# Patient Record
Sex: Male | Born: 1985 | Race: Black or African American | Hispanic: No | Marital: Single | Smoking: Current every day smoker
Health system: Southern US, Community
[De-identification: ages and names within clinical notes are randomized; demographics above are authoritative.]

## PROBLEM LIST (undated history)

## (undated) ENCOUNTER — Ambulatory Visit (HOSPITAL_COMMUNITY): Admission: EM | Payer: Self-pay | Source: Home / Self Care

## (undated) DIAGNOSIS — C801 Malignant (primary) neoplasm, unspecified: Secondary | ICD-10-CM

## (undated) DIAGNOSIS — F419 Anxiety disorder, unspecified: Secondary | ICD-10-CM

## (undated) DIAGNOSIS — F329 Major depressive disorder, single episode, unspecified: Secondary | ICD-10-CM

## (undated) DIAGNOSIS — F32A Depression, unspecified: Secondary | ICD-10-CM

## (undated) HISTORY — PX: BRAIN SURGERY: SHX531

## (undated) HISTORY — DX: Malignant (primary) neoplasm, unspecified: C80.1

---

## 2006-05-27 ENCOUNTER — Emergency Department (HOSPITAL_COMMUNITY): Admission: EM | Admit: 2006-05-27 | Discharge: 2006-05-27 | Payer: Self-pay | Admitting: Emergency Medicine

## 2007-01-04 ENCOUNTER — Emergency Department (HOSPITAL_COMMUNITY): Admission: EM | Admit: 2007-01-04 | Discharge: 2007-01-04 | Payer: Self-pay | Admitting: Emergency Medicine

## 2012-11-04 ENCOUNTER — Emergency Department (HOSPITAL_COMMUNITY)
Admission: EM | Admit: 2012-11-04 | Discharge: 2012-11-04 | Disposition: A | Discharge status: Home / Self Care | Payer: Self-pay | Attending: Emergency Medicine | Admitting: Emergency Medicine

## 2012-11-04 ENCOUNTER — Telehealth (HOSPITAL_COMMUNITY): Payer: Self-pay

## 2012-11-04 ENCOUNTER — Encounter (HOSPITAL_COMMUNITY): Payer: Self-pay | Admitting: Emergency Medicine

## 2012-11-04 ENCOUNTER — Encounter (HOSPITAL_COMMUNITY): Payer: Self-pay

## 2012-11-04 ENCOUNTER — Inpatient Hospital Stay (HOSPITAL_COMMUNITY): Admit: 2012-11-04 | Payer: Self-pay

## 2012-11-04 DIAGNOSIS — F32A Depression, unspecified: Secondary | ICD-10-CM

## 2012-11-04 DIAGNOSIS — F10929 Alcohol use, unspecified with intoxication, unspecified: Secondary | ICD-10-CM

## 2012-11-04 DIAGNOSIS — J3489 Other specified disorders of nose and nasal sinuses: Secondary | ICD-10-CM | POA: Insufficient documentation

## 2012-11-04 DIAGNOSIS — C801 Malignant (primary) neoplasm, unspecified: Secondary | ICD-10-CM | POA: Insufficient documentation

## 2012-11-04 DIAGNOSIS — F1010 Alcohol abuse, uncomplicated: Secondary | ICD-10-CM | POA: Insufficient documentation

## 2012-11-04 DIAGNOSIS — R45851 Suicidal ideations: Secondary | ICD-10-CM | POA: Insufficient documentation

## 2012-11-04 DIAGNOSIS — F419 Anxiety disorder, unspecified: Secondary | ICD-10-CM

## 2012-11-04 DIAGNOSIS — F3289 Other specified depressive episodes: Secondary | ICD-10-CM | POA: Insufficient documentation

## 2012-11-04 DIAGNOSIS — R45850 Homicidal ideations: Secondary | ICD-10-CM | POA: Insufficient documentation

## 2012-11-04 DIAGNOSIS — F329 Major depressive disorder, single episode, unspecified: Secondary | ICD-10-CM | POA: Insufficient documentation

## 2012-11-04 LAB — COMPREHENSIVE METABOLIC PANEL
ALT: 16 U/L (ref 0–53)
AST: 30 U/L (ref 0–37)
Alkaline Phosphatase: 65 U/L (ref 39–117)
CO2: 28 mEq/L (ref 19–32)
Chloride: 103 mEq/L (ref 96–112)
GFR calc Af Amer: >90 mL/min (ref >90)
GFR calc non Af Amer: >90 mL/min (ref >90)
Glucose, Bld: 94 mg/dL (ref 70–99)
Sodium: 140 mEq/L (ref 135–145)
Total Bilirubin: 0.1 mg/dL — ABNORMAL LOW (ref 0.3–1.2)

## 2012-11-04 LAB — CBC
Hemoglobin: 15 g/dL (ref 13.0–17.0)
MCH: 32.4 pg (ref 26.0–34.0)
Platelets: 216 10*3/uL (ref 150–400)
RBC: 4.63 MIL/uL (ref 4.22–5.81)
WBC: 5.6 10*3/uL (ref 4.0–10.5)

## 2012-11-04 LAB — RAPID URINE DRUG SCREEN, HOSP PERFORMED
Barbiturates: NOT DETECTED
Cocaine: POSITIVE — AB
Tetrahydrocannabinol: POSITIVE — AB

## 2012-11-04 MED ORDER — LORAZEPAM 1 MG PO TABS: 1.0000 mg | ORAL_TABLET | Freq: Three times a day (TID) | ORAL | Status: DC | PRN

## 2012-11-04 MED ORDER — ZIPRASIDONE MESYLATE 20 MG IM SOLR
10.0000 mg | Freq: Once | INTRAMUSCULAR | Status: AC
  Administered 2012-11-04: 10 mg via INTRAMUSCULAR
  Filled 2012-11-04: qty 20

## 2012-11-04 NOTE — [facility] Counselor (Signed)
Patient evaluated by Dr. Elsie Saas and per his recommendations patient will be discharged home with follow up referrals. Writer met with patient and provided him with various outpatient referrals for both mental health and substance abuse programs. Patient wasn't receptive to participating in any substance abuse programs, however; agreeable to follow up with [facility] for medication management.

## 2012-11-04 NOTE — BH Assessment (Signed)
[facility] Assessment Progress Note    Patient's mother said that the patient had been drinking tonight. The amount and pattern of usage of alcohol is unknown, due to the patient being too intoxicated and psychotic to answer questions. The patient's mother also provided collateral information about his medical history. 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 The tumor responded to chemotherapy and radiation, and the patient has been cancer-free ever since, according to mother. She denies any knowledge of the patient's using drugs. The patient was accepted by Jorje Guild, PA pending medical clearance and stabilization at [facility]. The patient was also sent to [facility] for involuntary commitment, since he refused to sign himself in.
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
## 2012-11-04 NOTE — ED Notes (Signed)
Sitter present at pt bedside.
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
## 2012-11-04 NOTE — ED Provider Notes (Signed)
History     CSN: [id number]  Arrival date & time 11/04/12  0300   First MD Initiated Contact with Patient 11/04/12 0310      Chief Complaint  Patient presents with  . Medical Clearance    (Consider location/radiation/quality/duration/timing/severity/associated sxs/prior treatment) The history is provided by the patient and the police. The history is limited by the condition of the patient.  pt w hx depression, from [facility] where his mother brought him. Per report, depressed since death of son.  Has been drinking intermittently since then, thoughts of depression, tonight making statements to family about using machete to harm self, others. Pt admits to etoh abuse, cant quantify amount. Pt uncooperative - level 5 caveat.    Past Medical History  Diagnosis Date  . Cancer     History reviewed. No pertinent past surgical history.  No family history on file.  History  Substance Use Topics  . Smoking status: Not on file  . Smokeless tobacco: Not on file  . Alcohol Use: Yes      Review of Systems  Unable to perform ROS: Psychiatric disorder  level 5 caveat   Allergies  Review of patient's allergies indicates no known allergies.  Home Medications  No current outpatient prescriptions on file.  BP 152/97  Pulse 81  Temp(Src) 98.2 F (36.8 C) (Oral)  Resp 18  SpO2 100%  Physical Exam  Nursing note and vitals reviewed. Constitutional: He appears well-developed and well-nourished. No distress.  HENT:  Head: Atraumatic.  Mouth/Throat: Oropharyngeal exudate present.  Eyes: Conjunctivae are normal. Pupils are equal, round, and reactive to light. No scleral icterus.  Neck: Neck supple. No tracheal deviation present.  Cardiovascular: Normal rate.   Pulmonary/Chest: Effort normal. No accessory muscle usage. No respiratory distress.  Abdominal: Soft. He exhibits no distension. There is no tenderness.  Musculoskeletal: Normal range of motion. He exhibits no edema and no  tenderness.  Neurological: He is alert.  Alert, ambulatory in room.   Skin: Skin is warm and dry. He is not diaphoretic.  Psychiatric:  Agitated, angry, uncooperative.     ED Course  Procedures (including critical care time)  Results for orders placed during the hospital encounter of 11/04/12  CBC      Result Value Range   WBC 5.6  4.0 - 10.5 K/uL   RBC 4.63  4.22 - 5.81 MIL/uL   Hemoglobin 15.0  13.0 - 17.0 g/dL   HCT 82.9  56.2 - 13.0 %   MCV 91.1  78.0 - 100.0 fL   MCH 32.4  26.0 - 34.0 pg   MCHC 35.5  30.0 - 36.0 g/dL   RDW 86.5  78.4 - 69.6 %   Platelets 216  150 - 400 K/uL  COMPREHENSIVE METABOLIC PANEL      Result Value Range   Sodium 140  135 - 145 mEq/L   Potassium 3.7  3.5 - 5.1 mEq/L   Chloride 103  96 - 112 mEq/L   CO2 28  19 - 32 mEq/L   Glucose, Bld 94  70 - 99 mg/dL   BUN 6  6 - 23 mg/dL   Creatinine, Ser 2.95  0.50 - 1.35 mg/dL   Calcium 9.0  8.4 - 28.4 mg/dL   Total Protein 7.8  6.0 - 8.3 g/dL   Albumin 4.3  3.5 - 5.2 g/dL   AST 30  0 - 37 U/L   ALT 16  0 - 53 U/L   Alkaline Phosphatase 65  39 -  117 U/L   Total Bilirubin 0.1 (*) 0.3 - 1.2 mg/dL   GFR calc non Af Amer >90  >90 mL/min   GFR calc Af Amer >90  >90 mL/min  ETHANOL      Result Value Range   Alcohol, Ethyl (B) 318 (*) 0 - 11 mg/dL        MDM  Labs.  Reviewed nursing notes and prior charts for additional history.   Law enforcement notes pt making threatening statements about harming self or others. Pt uncooperative w eval in ed.  ivc papers filled out as pt appears danger to self/others.  Act team called to assess/place.   Pt with paranoid thoughts, acting as if staff trying to poison him. Periods agitation. Attempt to calm.  Pt remains w agitation, ?paranoid thoughts.  geodon im.  Act team called.  etoh returns very high.  Will plan telepsych eval when more sober, able to comply w interview.   Will sign out to am provider to f/u with telepsych eval, recheck pt, and  dispo appropriately.       Suzi Roots, MD 11/04/12 [phone]

## 2012-11-04 NOTE — BH Assessment (Signed)
[facility] Assessment Progress Note  Reviewed pt with Assunta Found, FNP.  She requests the following: Urine Drug Screen, repeat vital signs, and EKG.  At 09:38 I called Melynda Ripple, Assessment Counselor, to notify her.  Doylene Canning, MA Assessment Counselor 11/04/2012 @ 09:58

## 2012-11-04 NOTE — [facility]HH Counselor (Signed)
Per patients nurse, Sue Lush she has spoken with Colin Pellegrini, NP and the EKG is not needed. Patient needs repeat vitals which was completed. Patient also needs UDS and has not been completed. Sue Lush will continue working with patient to obtain his UDS.
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
## 2012-11-04 NOTE — ED Notes (Signed)
Unable to obtain informations to complete assessment at this time, pt. Is uncooperative.

## 2012-11-04 NOTE — [facility] Suicide Risk Assessment (Signed)
Suicide Risk Assessment  Discharge Assessment     Demographic Factors:  Male, Adolescent or young adult and Low socioeconomic status  Mental Status Per Nursing Assessment::   On Admission:     Current Mental Status by Physician: NA  Loss Factors: Financial problems/change in socioeconomic status   Historical Factors: NA  Risk Reduction Factors:   Sense of responsibility to family, Living with another person, especially a relative, Positive social support and Positive therapeutic relationship  Continued Clinical Symptoms:  Alcohol/Substance Abuse/Dependencies  Cognitive Features That Contribute To Risk:  Polarized thinking    Suicide Risk:  Minimal: No identifiable suicidal ideation.  Patients presenting with no risk factors but with morbid ruminations; may be classified as minimal risk based on the severity of the depressive symptoms  Discharge Diagnoses:   AXIS I:  Alcohol Abuse and Depressive Disorder NOS AXIS II:  Deferred AXIS III:   Past Medical History  Diagnosis Date  . Cancer    AXIS IV:  other psychosocial or environmental problems and problems related to social environment AXIS V:  51-60 moderate symptoms  Plan Of Care/Follow-up recommendations:  Activity:  as toleratred Diet:  regular  Is patient on multiple antipsychotic therapies at discharge:  No   Has Patient had three or more failed trials of antipsychotic monotherapy by history:  No  Recommended Plan for Multiple Antipsychotic Therapies: Not applicable  Colin Gamble,Colin R. 11/04/2012, 11:55 AM
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
## 2012-11-11 ENCOUNTER — Encounter (HOSPITAL_COMMUNITY): Payer: Self-pay | Admitting: *Deleted

## 2012-11-11 ENCOUNTER — Emergency Department (HOSPITAL_COMMUNITY)
Admission: EM | Admit: 2012-11-11 | Discharge: 2012-11-11 | Disposition: A | Discharge status: Home / Self Care | Payer: Self-pay | Attending: Emergency Medicine | Admitting: Emergency Medicine

## 2012-11-11 ENCOUNTER — Inpatient Hospital Stay (HOSPITAL_COMMUNITY)
Admission: AD | Admit: 2012-11-11 | Payer: No Typology Code available for payment source | Source: Intra-hospital | Admitting: Psychiatry

## 2012-11-11 DIAGNOSIS — R443 Hallucinations, unspecified: Secondary | ICD-10-CM | POA: Insufficient documentation

## 2012-11-11 DIAGNOSIS — R45851 Suicidal ideations: Secondary | ICD-10-CM | POA: Insufficient documentation

## 2012-11-11 DIAGNOSIS — F329 Major depressive disorder, single episode, unspecified: Secondary | ICD-10-CM | POA: Insufficient documentation

## 2012-11-11 DIAGNOSIS — F3289 Other specified depressive episodes: Secondary | ICD-10-CM | POA: Insufficient documentation

## 2012-11-11 DIAGNOSIS — F1410 Cocaine abuse, uncomplicated: Secondary | ICD-10-CM | POA: Insufficient documentation

## 2012-11-11 DIAGNOSIS — Z859 Personal history of malignant neoplasm, unspecified: Secondary | ICD-10-CM | POA: Insufficient documentation

## 2012-11-11 DIAGNOSIS — F1010 Alcohol abuse, uncomplicated: Secondary | ICD-10-CM | POA: Insufficient documentation

## 2012-11-11 LAB — CBC WITH DIFFERENTIAL/PLATELET
Basophils Relative: 1 % (ref 0–1)
HCT: 40.5 % (ref 39.0–52.0)
Hemoglobin: 14.5 g/dL (ref 13.0–17.0)
Lymphocytes Relative: 43 % (ref 12–46)
MCHC: 35.8 g/dL (ref 30.0–36.0)
Monocytes Relative: 7 % (ref 3–12)
Neutro Abs: 2.6 10*3/uL (ref 1.7–7.7)
Neutrophils Relative %: 49 % (ref 43–77)
RBC: 4.45 MIL/uL (ref 4.22–5.81)
WBC: 5.3 10*3/uL (ref 4.0–10.5)

## 2012-11-11 LAB — URINALYSIS, ROUTINE W REFLEX MICROSCOPIC
Leukocytes, UA: NEGATIVE
Protein, ur: NEGATIVE mg/dL
Urobilinogen, UA: 0.2 mg/dL (ref 0.0–1.0)

## 2012-11-11 LAB — COMPREHENSIVE METABOLIC PANEL
AST: 27 U/L (ref 0–37)
Albumin: 4.3 g/dL (ref 3.5–5.2)
Alkaline Phosphatase: 61 U/L (ref 39–117)
BUN: 9 mg/dL (ref 6–23)
CO2: 27 mEq/L (ref 19–32)
Chloride: 106 mEq/L (ref 96–112)
GFR calc non Af Amer: >90 mL/min (ref >90)
Potassium: 3.8 mEq/L (ref 3.5–5.1)
Total Bilirubin: 0.2 mg/dL — ABNORMAL LOW (ref 0.3–1.2)

## 2012-11-11 LAB — RAPID URINE DRUG SCREEN, HOSP PERFORMED
Amphetamines: NOT DETECTED
Tetrahydrocannabinol: NOT DETECTED

## 2012-11-11 LAB — ETHANOL: Alcohol, Ethyl (B): 282 mg/dL — ABNORMAL HIGH (ref 0–11)

## 2012-11-11 NOTE — ED Notes (Signed)
Pt states he is depressed and he does want detox.

## 2012-11-11 NOTE — ED Provider Notes (Signed)
History     CSN: [id number]  Arrival date & time 11/11/12  1153   First MD Initiated Contact with Patient 11/11/12 1204      Chief Complaint  Patient presents with  . Drug / Alcohol Assessment    (Consider location/radiation/quality/duration/timing/severity/associated sxs/prior treatment) HPI Pt went to [facility] this AM for depression and EtOH abuse and was told to come to the ED for evaluation. Pt states he has had increasing depression since stopping his medications in 2011. Admits to episodic heavy alcohol intake. Last drank this AM. Occasional cocaine use. Admits to occasional vague SI with no plan. No current SI or HI.  Past Medical History  Diagnosis Date  . Cancer     No past surgical history on file.  No family history on file.  History  Substance Use Topics  . Smoking status: Not on file  . Smokeless tobacco: Not on file  . Alcohol Use: 3.6 oz/week    6 Cans of beer per week      Review of Systems  Constitutional: Negative for fever.  Respiratory: Negative for cough and shortness of breath.   Cardiovascular: Negative for chest pain.  Gastrointestinal: Negative for nausea, vomiting and abdominal pain.  Musculoskeletal: Negative for back pain.  Skin: Negative for rash and wound.  Neurological: Negative for dizziness, weakness, light-headedness, numbness and headaches.  Psychiatric/Behavioral: Positive for suicidal ideas, behavioral problems and dysphoric mood.    Allergies  Review of patient's allergies indicates no known allergies.  Home Medications  No current outpatient prescriptions on file.  BP 125/64  Pulse 103  Temp(Src) 98.4 F (36.9 C) (Oral)  Resp 14  SpO2 98%  Physical Exam  Nursing note and vitals reviewed. Constitutional: He is oriented to person, place, and time. He appears well-developed and well-nourished. No distress.  HENT:  Head: Normocephalic and atraumatic.  Mouth/Throat: Oropharynx is clear and moist.  Eyes: EOM are normal.  Pupils are equal, round, and reactive to light.  Neck: Normal range of motion. Neck supple.  Cardiovascular: Normal rate and regular rhythm.   Pulmonary/Chest: Effort normal and breath sounds normal. No respiratory distress. He has no wheezes. He has no rales.  Abdominal: Soft. Bowel sounds are normal. He exhibits no distension and no mass. There is no tenderness. There is no rebound and no guarding.  Musculoskeletal: Normal range of motion. He exhibits no edema and no tenderness.  Neurological: He is alert and oriented to person, place, and time.  5/5 motor, sensation intact  Skin: Skin is warm and dry. No rash noted. No erythema.  Psychiatric:  Flat affect    ED Course  Procedures (including critical care time)  Labs Reviewed  COMPREHENSIVE METABOLIC PANEL - Abnormal; Notable for the following:    Total Bilirubin 0.2 (*)    All other components within normal limits  ETHANOL - Abnormal; Notable for the following:    Alcohol, Ethyl (B) 282 (*)    All other components within normal limits  CBC WITH DIFFERENTIAL  URINE RAPID DRUG SCREEN (HOSP PERFORMED)  URINALYSIS, ROUTINE W REFLEX MICROSCOPIC   No results found.   1. Alcohol abuse       MDM    Medically cleared for ACT eval      Loren Racer, MD 11/12/12 [phone]
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
## 2012-11-11 NOTE — [facility] Counselor (Signed)
Charyl Bigger, assessment counselor at [facility], submitted Pt for admission to [facility]. Shuvon Rankin, NP reviewed clinical information and accepted Pt to the service of Dr. Geoffery Lyons, room 305-2.  Harlin Rain Patsy Baltimore, LPC, [facility] Assessment Counselor

## 2012-11-11 NOTE — ED Notes (Signed)
Pt agrees he needs to stop drinking however, he doesn't want to go to detox facility. He said he will follow up this week at [facility]. He wanted EDP to give him prescription for anti-depressant but EDP refused. Pt denies SI/HI/AVH. Discussed with pt the fact he had a bed at [facility] for tonight and could probably leave there Sunday and be started on anti-depressant and detox because he's concerned about missing school but he still didn't want to go to treatment. Outpt referrals were given to him about a week ago and he said he still has them. Will try to locate more as we don't have ACTT here at this time.

## 2012-11-11 NOTE — ED Provider Notes (Signed)
Patient has been accepted at behavioral health. He no longer wants to stay. He is not suicidal or homicidal. He states he needs to go . He will followup.  Colin Gamble. Rubin Payor, MD 11/11/12 2116
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
## 2012-11-11 NOTE — ED Notes (Signed)
Cousin name is Deanna Artis [phone]  Cousin. Pt aunt name is beverly  [phone]  Pt lives in home with disabled mother and his aunt. Per cousin,  mother and aunt have to lock their bedroom doors at night because they are fearful of pt when he is  drinking. 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 He does not have a job. Pt get extremely agitated when he is drinking and starts fights and has hallucinations and becomes paranoid. Pt son died at 42 years old and pt was incarcerated and was unable to attend the funeral pt was taking prozac in jail but quit when he began school he quit his medication.

## 2012-11-11 NOTE — ED Notes (Signed)
Moving pt to psych ed

## 2012-12-30 ENCOUNTER — Emergency Department (HOSPITAL_COMMUNITY): Payer: Self-pay

## 2012-12-30 ENCOUNTER — Encounter (HOSPITAL_COMMUNITY): Payer: Self-pay | Admitting: *Deleted

## 2012-12-30 ENCOUNTER — Emergency Department (HOSPITAL_COMMUNITY)
Admission: EM | Admit: 2012-12-30 | Discharge: 2012-12-30 | Disposition: A | Discharge status: Home / Self Care | Payer: Self-pay | Attending: Emergency Medicine | Admitting: Emergency Medicine

## 2012-12-30 DIAGNOSIS — Y9210 Unspecified residential institution as the place of occurrence of the external cause: Secondary | ICD-10-CM | POA: Insufficient documentation

## 2012-12-30 DIAGNOSIS — S60212A Contusion of left wrist, initial encounter: Secondary | ICD-10-CM

## 2012-12-30 DIAGNOSIS — Y9389 Activity, other specified: Secondary | ICD-10-CM | POA: Insufficient documentation

## 2012-12-30 DIAGNOSIS — Z859 Personal history of malignant neoplasm, unspecified: Secondary | ICD-10-CM | POA: Insufficient documentation

## 2012-12-30 DIAGNOSIS — S60219A Contusion of unspecified wrist, initial encounter: Secondary | ICD-10-CM | POA: Insufficient documentation

## 2012-12-30 DIAGNOSIS — F17200 Nicotine dependence, unspecified, uncomplicated: Secondary | ICD-10-CM | POA: Insufficient documentation

## 2012-12-30 DIAGNOSIS — W2209XA Striking against other stationary object, initial encounter: Secondary | ICD-10-CM | POA: Insufficient documentation

## 2012-12-30 DIAGNOSIS — IMO0002 Reserved for concepts with insufficient information to code with codable children: Secondary | ICD-10-CM | POA: Insufficient documentation
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
## 2012-12-30 NOTE — ED Provider Notes (Signed)
Medical screening examination/treatment/procedure(s) were performed by non-physician practitioner and as supervising physician I was immediately available for consultation/collaboration.  Jasmine Awe, MD 12/30/12 [phone]
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
## 2013-01-19 ENCOUNTER — Emergency Department (HOSPITAL_COMMUNITY): Payer: Self-pay

## 2013-01-19 ENCOUNTER — Emergency Department (HOSPITAL_COMMUNITY)
Admission: EM | Admit: 2013-01-19 | Discharge: 2013-01-20 | Disposition: A | Discharge status: Home / Self Care | Payer: Self-pay | Attending: Emergency Medicine | Admitting: Emergency Medicine

## 2013-01-19 ENCOUNTER — Encounter (HOSPITAL_COMMUNITY): Payer: Self-pay | Admitting: *Deleted

## 2013-01-19 DIAGNOSIS — R209 Unspecified disturbances of skin sensation: Secondary | ICD-10-CM | POA: Insufficient documentation

## 2013-01-19 DIAGNOSIS — S025XXA Fracture of tooth (traumatic), initial encounter for closed fracture: Secondary | ICD-10-CM | POA: Insufficient documentation

## 2013-01-19 DIAGNOSIS — S02401A Maxillary fracture, unspecified, initial encounter for closed fracture: Secondary | ICD-10-CM | POA: Insufficient documentation

## 2013-01-19 DIAGNOSIS — S032XXA Dislocation of tooth, initial encounter: Secondary | ICD-10-CM

## 2013-01-19 DIAGNOSIS — Z23 Encounter for immunization: Secondary | ICD-10-CM | POA: Insufficient documentation

## 2013-01-19 DIAGNOSIS — Z859 Personal history of malignant neoplasm, unspecified: Secondary | ICD-10-CM | POA: Insufficient documentation

## 2013-01-19 DIAGNOSIS — F17200 Nicotine dependence, unspecified, uncomplicated: Secondary | ICD-10-CM | POA: Insufficient documentation

## 2013-01-19 DIAGNOSIS — S02400A Malar fracture unspecified, initial encounter for closed fracture: Secondary | ICD-10-CM | POA: Insufficient documentation

## 2013-01-19 MED ORDER — TETANUS-DIPHTH-ACELL PERTUSSIS 5-2.5-18.5 LF-MCG/0.5 IM SUSP
0.5000 mL | Freq: Once | INTRAMUSCULAR | Status: AC
  Administered 2013-01-19: 0.5 mL via INTRAMUSCULAR
  Filled 2013-01-19: qty 0.5

## 2013-01-19 MED ORDER — OXYCODONE-ACETAMINOPHEN 5-325 MG PO TABS
1.0000 | ORAL_TABLET | Freq: Once | ORAL | Status: AC
  Administered 2013-01-19: 1 via ORAL
  Filled 2013-01-19: qty 1
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
## 2013-01-20 MED ORDER — HYDROCODONE-ACETAMINOPHEN 5-325 MG PO TABS: 1.0000 | ORAL_TABLET | ORAL | Status: DC | PRN

## 2013-01-20 NOTE — ED Notes (Signed)
The patient is AOx4 and comfortable with his discharge instructions. 

## 2013-01-20 NOTE — ED Notes (Signed)
EDP at bedside  

## 2013-01-22 NOTE — ED Provider Notes (Signed)
Medical screening examination/treatment/procedure(s) were performed by non-physician practitioner and as supervising physician I was immediately available for consultation/collaboration.   Gilda Crease, MD 01/22/13 [phone]

## 2013-04-03 ENCOUNTER — Emergency Department (HOSPITAL_COMMUNITY): Payer: Self-pay

## 2013-04-03 ENCOUNTER — Emergency Department (HOSPITAL_COMMUNITY)
Admission: EM | Admit: 2013-04-03 | Discharge: 2013-04-04 | Disposition: A | Discharge status: Home / Self Care | Payer: Self-pay | Attending: Emergency Medicine | Admitting: Emergency Medicine

## 2013-04-03 ENCOUNTER — Encounter (HOSPITAL_COMMUNITY): Payer: Self-pay | Admitting: Emergency Medicine

## 2013-04-03 DIAGNOSIS — R0602 Shortness of breath: Secondary | ICD-10-CM | POA: Insufficient documentation

## 2013-04-03 DIAGNOSIS — Z859 Personal history of malignant neoplasm, unspecified: Secondary | ICD-10-CM | POA: Insufficient documentation

## 2013-04-03 DIAGNOSIS — F17200 Nicotine dependence, unspecified, uncomplicated: Secondary | ICD-10-CM | POA: Insufficient documentation

## 2013-04-03 DIAGNOSIS — R112 Nausea with vomiting, unspecified: Secondary | ICD-10-CM | POA: Insufficient documentation

## 2013-04-03 DIAGNOSIS — F411 Generalized anxiety disorder: Secondary | ICD-10-CM | POA: Insufficient documentation

## 2013-04-03 DIAGNOSIS — R0789 Other chest pain: Secondary | ICD-10-CM | POA: Insufficient documentation

## 2013-04-03 DIAGNOSIS — D649 Anemia, unspecified: Secondary | ICD-10-CM | POA: Insufficient documentation

## 2013-04-03 HISTORY — DX: Anxiety disorder, unspecified: F41.9

## 2013-04-03 LAB — CBC WITH DIFFERENTIAL/PLATELET
Eosinophils Relative: 3 % (ref 0–5)
MCH: 32.5 pg (ref 26.0–34.0)
Monocytes Absolute: 0.5 10*3/uL (ref 0.1–1.0)
Monocytes Relative: 16 % — ABNORMAL HIGH (ref 3–12)
Neutrophils Relative %: 25 % — ABNORMAL LOW (ref 43–77)
Platelets: 205 10*3/uL (ref 150–400)
RBC: 3.54 MIL/uL — ABNORMAL LOW (ref 4.22–5.81)
WBC: 3.3 10*3/uL — ABNORMAL LOW (ref 4.0–10.5)

## 2013-04-03 LAB — COMPREHENSIVE METABOLIC PANEL
ALT: 19 U/L (ref 0–53)
AST: 73 U/L — ABNORMAL HIGH (ref 0–37)
CO2: 24 mEq/L (ref 19–32)
Calcium: 8.7 mg/dL (ref 8.4–10.5)
GFR calc non Af Amer: >90 mL/min (ref >90)
Sodium: 138 mEq/L (ref 135–145)
Total Protein: 6.5 g/dL (ref 6.0–8.3)

## 2013-04-03 LAB — RAPID URINE DRUG SCREEN, HOSP PERFORMED
Amphetamines: NOT DETECTED
Cocaine: NOT DETECTED
Opiates: NOT DETECTED
Tetrahydrocannabinol: NOT DETECTED

## 2013-04-03 MED ORDER — ONDANSETRON HCL 4 MG/2ML IJ SOLN: 4.0000 mg | Freq: Once | INTRAMUSCULAR | Status: DC

## 2013-04-03 MED ORDER — SODIUM CHLORIDE 0.9 % IV BOLUS (SEPSIS)
1000.0000 mL | Freq: Once | INTRAVENOUS | Status: AC
  Administered 2013-04-03: 1000 mL via INTRAVENOUS
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
## 2013-04-03 NOTE — ED Provider Notes (Signed)
CSN: [id number]     Arrival date & time 04/03/13  1937 History   First MD Initiated Contact with Patient 04/03/13 1944     Chief Complaint  Patient presents with  . Chest Pain   (Consider location/radiation/quality/duration/timing/severity/associated sxs/prior Treatment) HPI Comments: Patient presents with chest pain that onset one hour ago and lasted 2 minutes and is now resolved. He states he didn't feel well because he ate some bad food at the gas station and was laying down and try to sleep. He experienced some shortness of breath, nausea and vomiting x3. The emesis is nonbilious and nonbloody. He then developed a left-sided chest pain for 2 minutes it is now resolved. Denies any fevers, cough, abdominal pain, diarrhea. He states he feels mostly better now. No focal weakness, numbness or tingling. No history of cardiac problems. He is an active smoker.  The history is provided by the EMS personnel and the patient.    Past Medical History  Diagnosis Date  . Cancer   . Anxiety    Past Surgical History  Procedure Laterality Date  . Brain surgery      as a baby   No family history on file. History  Substance Use Topics  . Smoking status: Current Every Day Smoker -- 1.00 packs/day  . Smokeless tobacco: Not on file  . Alcohol Use: 3.6 oz/week    6 Cans of beer per week    Review of Systems  Constitutional: Negative for fever, activity change and appetite change.  HENT: Negative for congestion and rhinorrhea.   Respiratory: Negative for cough, chest tightness and shortness of breath.   Cardiovascular: Positive for chest pain.  Gastrointestinal: Positive for nausea and vomiting. Negative for abdominal pain and diarrhea.  Genitourinary: Negative for dysuria, urgency and hematuria.  Musculoskeletal: Negative for back pain.  Skin: Negative for rash.  Neurological: Negative for dizziness, weakness and headaches.  A complete 10 system review of systems was obtained and all systems are  negative except as noted in the HPI and PMH.    Allergies  Review of patient's allergies indicates no known allergies.  Home Medications   No current outpatient prescriptions on file. BP 117/65  Pulse 64  Temp(Src) 98.6 F (37 C) (Oral)  Resp 16  SpO2 100% Physical Exam  Constitutional: He is oriented to person, place, and time. He appears well-developed and well-nourished. No distress.  Appears anxious  HENT:  Head: Normocephalic and atraumatic.  Mouth/Throat: Oropharynx is clear and moist. No oropharyngeal exudate.  Eyes: Conjunctivae and EOM are normal. Pupils are equal, round, and reactive to light.  Neck: Normal range of motion. Neck supple.  Cardiovascular: Normal rate, regular rhythm and normal heart sounds.   No murmur heard. Pulmonary/Chest: Effort normal and breath sounds normal. No respiratory distress. He exhibits tenderness.  Reproducible left-sided tenderness  Abdominal: Soft. There is no tenderness. There is no rebound and no guarding.  Musculoskeletal: Normal range of motion. He exhibits no edema and no tenderness.  Neurological: He is alert and oriented to person, place, and time. No cranial nerve deficit. He exhibits normal muscle tone. Coordination normal.  Skin: Skin is warm.    ED Course  Procedures (including critical care time) Labs Review Labs Reviewed  CBC WITH DIFFERENTIAL - Abnormal; Notable for the following:    WBC 3.3 (*)    RBC 3.54 (*)    Hemoglobin 11.5 (*)    HCT 32.5 (*)    Neutrophils Relative % 25 (*)  Lymphocytes Relative 55 (*)    Monocytes Relative 16 (*)    Neutro Abs 0.8 (*)    All other components within normal limits  COMPREHENSIVE METABOLIC PANEL - Abnormal; Notable for the following:    Potassium 3.4 (*)    Albumin 3.4 (*)    AST 73 (*)    All other components within normal limits  TROPONIN I  D-DIMER, QUANTITATIVE  URINE RAPID DRUG SCREEN (HOSP PERFORMED)  TROPONIN I  POCT I-STAT TROPONIN I   Imaging Review Dg  Chest 2 View  04/03/2013   *RADIOLOGY REPORT*  Clinical Data: Chest pain  CHEST - 2 VIEW  Comparison: None.  Findings: No significant osseous abnormality.  Lungs are clear. No effusion or pneumothorax.  Cardiomediastinal size and contour are within normal limits.  The stomach is more to the left than unexpected, likely within normal limits however.  IMPRESSION: No evidence of acute cardiopulmonary disease.   Original Report Authenticated By: Tiburcio Pea    MDM   1. Atypical chest pain   2. Anemia    Episode a few minutes of chest pain that is now resolved. Associated with some nausea and vomiting. Atypical for ACS. Pain is reproducible to exam.  EKG nonischemic. Patient's chest pain is atypical for ACS and lasted just a few minutes and is now resolved. He does have chest soreness to palpation. Pain started after vomiting after eating what he thought was a bad food.  Labs remarkable for hemoglobin 11.5, was 14.5 five months ago.  Patient denies any blood in stools.  He declines rectal exam.  Patient is feeling better. He is tolerating by mouth in ED. Low suspicion for ACS or PE. He is stable for discharge. Return precautions discussed. Will need further outpatient workup of anemia.    Date: 04/03/2013  Rate: 62  Rhythm: normal sinus rhythm  QRS Axis: normal  Intervals: normal  ST/T Wave abnormalities: normal  Conduction Disutrbances:none  Narrative Interpretation:   Old EKG Reviewed: none available    Glynn Octave, MD 04/04/13 0005

## 2013-04-03 NOTE — ED Notes (Signed)
Family at bedside. 

## 2013-08-20 ENCOUNTER — Encounter (HOSPITAL_COMMUNITY): Payer: Self-pay | Admitting: Emergency Medicine

## 2013-08-20 ENCOUNTER — Emergency Department (HOSPITAL_COMMUNITY)
Admission: EM | Admit: 2013-08-20 | Discharge: 2013-08-21 | Disposition: A | Discharge status: Other Acute Inpatient Hospital | Payer: No Typology Code available for payment source | Attending: Emergency Medicine | Admitting: Emergency Medicine

## 2013-08-20 DIAGNOSIS — F319 Bipolar disorder, unspecified: Secondary | ICD-10-CM | POA: Insufficient documentation

## 2013-08-20 DIAGNOSIS — F1410 Cocaine abuse, uncomplicated: Secondary | ICD-10-CM | POA: Insufficient documentation

## 2013-08-20 DIAGNOSIS — F10929 Alcohol use, unspecified with intoxication, unspecified: Secondary | ICD-10-CM

## 2013-08-20 DIAGNOSIS — F17200 Nicotine dependence, unspecified, uncomplicated: Secondary | ICD-10-CM | POA: Insufficient documentation

## 2013-08-20 DIAGNOSIS — Z9889 Other specified postprocedural states: Secondary | ICD-10-CM | POA: Insufficient documentation

## 2013-08-20 DIAGNOSIS — R4689 Other symptoms and signs involving appearance and behavior: Secondary | ICD-10-CM

## 2013-08-20 DIAGNOSIS — F32A Depression, unspecified: Secondary | ICD-10-CM | POA: Diagnosis present

## 2013-08-20 DIAGNOSIS — Z91199 Patient's noncompliance with other medical treatment and regimen due to unspecified reason: Secondary | ICD-10-CM | POA: Insufficient documentation

## 2013-08-20 DIAGNOSIS — Z859 Personal history of malignant neoplasm, unspecified: Secondary | ICD-10-CM | POA: Insufficient documentation

## 2013-08-20 DIAGNOSIS — F1010 Alcohol abuse, uncomplicated: Secondary | ICD-10-CM | POA: Insufficient documentation

## 2013-08-20 DIAGNOSIS — F1994 Other psychoactive substance use, unspecified with psychoactive substance-induced mood disorder: Secondary | ICD-10-CM | POA: Diagnosis present

## 2013-08-20 DIAGNOSIS — Z9119 Patient's noncompliance with other medical treatment and regimen: Secondary | ICD-10-CM | POA: Insufficient documentation

## 2013-08-20 DIAGNOSIS — F911 Conduct disorder, childhood-onset type: Secondary | ICD-10-CM | POA: Insufficient documentation

## 2013-08-20 DIAGNOSIS — F329 Major depressive disorder, single episode, unspecified: Secondary | ICD-10-CM | POA: Diagnosis present

## 2013-08-20 HISTORY — DX: Depression, unspecified: F32.A

## 2013-08-20 HISTORY — DX: Major depressive disorder, single episode, unspecified: F32.9

## 2013-08-20 LAB — CBC
HCT: 42.2 % (ref 39.0–52.0)
Hemoglobin: 15.1 g/dL (ref 13.0–17.0)
MCH: 32.6 pg (ref 26.0–34.0)
MCHC: 35.8 g/dL (ref 30.0–36.0)
MCV: 91.1 fL (ref 78.0–100.0)
PLATELETS: 260 10*3/uL (ref 150–400)
RBC: 4.63 MIL/uL (ref 4.22–5.81)
RDW: 14.3 % (ref 11.5–15.5)
WBC: 9.2 10*3/uL (ref 4.0–10.5)

## 2013-08-20 LAB — COMPREHENSIVE METABOLIC PANEL
ALBUMIN: 4.6 g/dL (ref 3.5–5.2)
ALK PHOS: 68 U/L (ref 39–117)
ALT: 13 U/L (ref 0–53)
AST: 27 U/L (ref 0–37)
BUN: 9 mg/dL (ref 6–23)
CALCIUM: 8.9 mg/dL (ref 8.4–10.5)
CO2: 22 mEq/L (ref 19–32)
Chloride: 104 mEq/L (ref 96–112)
Creatinine, Ser: 1.16 mg/dL (ref 0.50–1.35)
GFR calc non Af Amer: 86 mL/min — ABNORMAL LOW (ref >90)
GLUCOSE: 93 mg/dL (ref 70–99)
POTASSIUM: 3.8 meq/L (ref 3.7–5.3)
SODIUM: 145 meq/L (ref 137–147)
TOTAL PROTEIN: 8.1 g/dL (ref 6.0–8.3)
Total Bilirubin: 0.3 mg/dL (ref 0.3–1.2)

## 2013-08-20 LAB — ACETAMINOPHEN LEVEL

## 2013-08-20 LAB — RAPID URINE DRUG SCREEN, HOSP PERFORMED
Amphetamines: NOT DETECTED
BENZODIAZEPINES: NOT DETECTED
Barbiturates: NOT DETECTED
COCAINE: POSITIVE — AB
OPIATES: NOT DETECTED
TETRAHYDROCANNABINOL: NOT DETECTED

## 2013-08-20 LAB — SALICYLATE LEVEL: Salicylate Lvl: <2 mg/dL — ABNORMAL LOW (ref 2.8–20.0)

## 2013-08-20 LAB — ETHANOL: Alcohol, Ethyl (B): 397 mg/dL — ABNORMAL HIGH (ref 0–11)

## 2013-08-20 MED ORDER — IBUPROFEN 200 MG PO TABS: 600.0000 mg | ORAL_TABLET | Freq: Three times a day (TID) | ORAL | Status: DC | PRN

## 2013-08-20 MED ORDER — LORAZEPAM 1 MG PO TABS
1.0000 mg | ORAL_TABLET | Freq: Three times a day (TID) | ORAL | Status: DC | PRN
  Administered 2013-08-20: 1 mg via ORAL
  Filled 2013-08-20: qty 1

## 2013-08-20 MED ORDER — ALUM & MAG HYDROXIDE-SIMETH 200-200-20 MG/5ML PO SUSP: 30.0000 mL | ORAL | Status: DC | PRN

## 2013-08-20 MED ORDER — NICOTINE 21 MG/24HR TD PT24
21.0000 mg | MEDICATED_PATCH | Freq: Every day | TRANSDERMAL | Status: DC
  Filled 2013-08-20: qty 1

## 2013-08-20 MED ORDER — TRAZODONE HCL 50 MG PO TABS
50.0000 mg | ORAL_TABLET | Freq: Every evening | ORAL | Status: DC | PRN
  Administered 2013-08-20: 50 mg via ORAL
  Filled 2013-08-20: qty 1

## 2013-08-20 MED ORDER — ONDANSETRON HCL 4 MG PO TABS: 4.0000 mg | ORAL_TABLET | Freq: Three times a day (TID) | ORAL | Status: DC | PRN

## 2013-08-20 NOTE — ED Notes (Signed)
Pt is unwilling to speak to this Probation officer w/o encouragement from [facility] officer.  Pt is aggressive verbally, admits to drinking tonight.  Pt had cocaine on his person when the [facility] officer Young checked his jacket pocket.

## 2013-08-20 NOTE — ED Notes (Addendum)
Pt's Belongings placed in locker # 26 1 pair green and white tennis shoes 1 pair black socks 1 white t-shirt 1 pair blue jeans 1 black jacket 1 pair green basketball shorts 1 pair black boxer briefs 1 brown belt 1 tan wallet w/ ID card and bank card

## 2013-08-20 NOTE — ED Notes (Signed)
Up to the bathroom 

## 2013-08-20 NOTE — ED Provider Notes (Signed)
CSN: [id number]     Arrival date & time 08/20/13  8242 History   First MD Initiated Contact with Patient 08/20/13 [phone]     Chief Complaint  Patient presents with  . Medical Clearance   (Consider location/radiation/quality/duration/timing/severity/associated sxs/prior Treatment) HPI History provided by pt and IVC paperwork.  Pt IVC'd because he is non-compliant w/ his bipolar medications and has been aggressive towards family.  Was throwing things this morning and stated that everyone was going to die.  Pt denies this accusation as well as both SI/HI.  He stopped his medications "a long time ago" but will not say why.  He used cocaine last night.  He will not say whether or not he was drinking alcohol.   Past Medical History  Diagnosis Date  . Cancer   . Anxiety    Past Surgical History  Procedure Laterality Date  . Brain surgery      as a baby   No family history on file. History  Substance Use Topics  . Smoking status: Current Every Day Smoker -- 1.00 packs/day  . Smokeless tobacco: Not on file  . Alcohol Use: 3.6 oz/week    6 Cans of beer per week    Review of Systems  All other systems reviewed and are negative.    Allergies  Review of patient's allergies indicates no known allergies.  Home Medications  No current outpatient prescriptions on file. BP 153/81  Pulse 115  Resp 18  Ht 5\' 5"  (1.651 m)  Wt 120 lb (54.432 kg)  BMI 19.97 kg/m2  SpO2 100% Physical Exam  Nursing note and vitals reviewed. Constitutional: He is oriented to person, place, and time. He appears well-developed and well-nourished. No distress.  HENT:  Head: Normocephalic and atraumatic.  Eyes:  Diffuse conjunctival injection  Neck: Normal range of motion.  Pulmonary/Chest: Effort normal.  Musculoskeletal: Normal range of motion.  Neurological: He is alert and oriented to person, place, and time.  Psychiatric: He has a normal mood and affect. His behavior is normal.    ED Course   Procedures (including critical care time) Labs Review Labs Reviewed  COMPREHENSIVE METABOLIC PANEL - Abnormal; Notable for the following:    GFR calc non Af Amer 86 (*)    All other components within normal limits  ETHANOL - Abnormal; Notable for the following:    Alcohol, Ethyl (B) 397 (*)    All other components within normal limits  SALICYLATE LEVEL - Abnormal; Notable for the following:    Salicylate Lvl <1.4 (*)    All other components within normal limits  URINE RAPID DRUG SCREEN (HOSP PERFORMED) - Abnormal; Notable for the following:    Cocaine POSITIVE (*)    All other components within normal limits  ACETAMINOPHEN LEVEL  CBC   Imaging Review No results found.  EKG Interpretation   None       MDM   1. Aggressive behavior   2. Alcohol intoxication    28yo M w/ bipolar disorder, non-compliant w/ medications, presents w/ alcohol intoxication and aggressive behavior towards family.  He has been IVC'd by his mother.  Uncooperative and mildly agitated.  TTS unsuccessful w/ their evaluation.  Labs sig  for etoh 397.  Will reassess in a few hours.   Baird Cancer, PA-C to resume care. 5:22 AM       Remer Macho, PA-C 08/20/13 [phone]  Medical screening examination/treatment/procedure(s) were conducted as a shared visit with non-physician practitioner(s) or resident  and myself.  I personally evaluated the patient during the encounter and agree with the findings and plan unless otherwise indicated.    I have personally reviewed any xrays and/ or EKG's with the provider and I agree with interpretation.   Pt brought in with IVC paperwork for bipolar control.  Pt clinically intoxicated, difficult exam due to intoxication, moves all ext equal, slurred speech, intermittent agitation in ER.  Neck supple no meningismus, perrrl, mild tachycardia. Plan for psych labs and eval once sober.  Pt is not to be allowed to leave until psychiatry evaluates.    Mariea Clonts,  MD 08/20/13 [phone]

## 2013-08-20 NOTE — BH Assessment (Addendum)
[place] Assessment Progress Note   Clinician attempted to assess patient.  When asked if he had been drinking, patient said two beers.  When asked if they were 12oz or 40oz he refused to answer and proceeded to mock the question.  Patient would not answer follow up questions when asked if he had made threats to family, only saying "no."  Patient became irritated and invited clinician to leave the room.  Seeing that patient would not be cooperative any longer, clinician left the room.  Cliician did talk to [place] who said that patient was unable to provide a UDS at this time.  The BAL is not back yet.  In fact, TTS consult was put in before any labs were back.  Patient will be seen again later.  Pt's BAL was 397 at 04:14.  Pt will be assessed when it is below 200.

## 2013-08-20 NOTE — ED Notes (Signed)
Telepsych completed.  

## 2013-08-20 NOTE — BH Assessment (Addendum)
Tele Assessment Note   Colin Gamble is an 28 y.o. male that was assessed via tele assessment (this scheduled with pt's nurse, Adela Lank, and also called EDP Knapp for clinical for the pt @ [phone]) after presenting to [facility] under IVC.  Per IVC papers, pt has stopped taking his medications for Bipolar Disorder and has gotten increasingly aggressive with his family.  Pt has also been verbally aggressive with ED staff.  However, pt was calm and cooperative during assessment.  Pt admitted to drinking "a bottle" of liquor last night, but cannot recall why the police were called by report or why he is in the ED.  Pt also admitted to ED staff that he used cocaine last night and this was found on his person per ED notes.  Pt stated he drinks three times per week, but cannot recall amount.  Per previous clinician notes, pt binge drinks.  Pt reports depressive sx related to the death of his 60 year old son in 2011.  Pt stated he was prescribed Prozac in the past but that it didn't help him and he would like to be put on a medication for his depression.  Pt has not had any MH/SA treatment since being hospitalized at [facility] 11/2012 and reported he hasn't had any psychotropic meds "in a long time."  Pt adamantly denies SI or HI.  He does admit to both auditory and visual hallucinations, but will not elaborate on this.  Pt reports racing thoughts and bing unable to sleep.  Pt lives with his mother and Aunt by report and is not currently working.  Pt stated he has a current legal charge for trespassing and a court date on 08/23/13.  He stated he just finished school at [employer].  Pt requesting to go home, stating he will follow up with [facility].  Attempted to call pt's mother for collateral information, and the number didn't work @ 1027.  Updated EDP Tomi Bamberger, who was in agreement with extender seeing pt in ED for further recommendations and evaluation @ 1038.  Updated TTS staff.  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 Cancer   . Anxiety   . Depression    Axis IV: other psychosocial or environmental problems, problems related to legal system/crime, problems with access to health care services and problems with primary support group Axis V: 31-40 impairment in reality testing  Past Medical History:  Past Medical History  Diagnosis Date  . Cancer   . Anxiety   . Depression     Past Surgical History  Procedure Laterality Date  . Brain surgery      as a baby    Family History: No family history on file.  Social History:  reports that he has been smoking.  He does not have any smokeless tobacco history on file. He reports that he drinks about 3.6 ounces of alcohol per week. He reports that he does not use illicit drugs.  Additional Social History:  Alcohol / Drug Use Pain Medications: none per pt Prescriptions: none per pt Over the Counter: none per pt History of alcohol / drug use?: Yes Longest period of sobriety (when/how long): unknown Negative Consequences of Use: Personal relationships Withdrawal Symptoms:  (pt denies) Substance #1 Name of Substance 1: ETOH 1 - Age of First Use: [age] 1 - Amount (size/oz): Unknown- drinks to excess when he binges  1 - Frequency: 3 x/week 1 - Duration: ongoing 1 - Last Use / Amount: 08/19/13 - one bottle of liquor  CIWA: CIWA-Ar BP: 119/54 mmHg Pulse Rate: 79 COWS:    Allergies: No Known Allergies  Home Medications:  (Not in a hospital admission)  OB/GYN Status:  No LMP for male patient.  General Assessment Data Location of Assessment: [facility] ED Is this a Tele or Face-to-Face Assessment?: Tele Assessment Is this an Initial Assessment or a Re-assessment for this encounter?: Initial Assessment Living Arrangements: Parent;Other relatives Can pt return to current living arrangement?: Yes Admission Status: Involuntary Is patient  capable of signing voluntary admission?: Yes Transfer from: [facility] Referral Source: Self/Family/Friend  Medical Screening Exam ([place]) Medical Exam completed: No Reason for MSE not completed: Other: (pt med cleared at [facility])  [place] Arrangements: Parent;Other relatives Name of Psychiatrist: none Name of Therapist: none  Education Status Is patient currently in school?: No Highest grade of school patient has completed: community college Name of school: [employer] person: self  Risk to self Suicidal Ideation: No Suicidal Intent: No Is patient at risk for suicide?: No Suicidal Plan?: No Access to Means: No What has been your use of drugs/alcohol within the last 12 months?: pt admits to ETOH use, reported used cocaine last night Previous Attempts/Gestures: No How many times?: 0 Other Self Harm Risks: pt denies Triggers for Past Attempts: Other (Comment) (death of 49 year old son in November 24, 2009) Intentional Self Injurious Behavior: Damaging Comment - Self Injurious Behavior: ongoing SA Family Suicide History: Unknown Recent stressful life event(s): Conflict (Comment);Legal Issues;Recent negative physical changes;Turmoil (Comment);Other (Comment) (Depression, SA, Aggressive, Off of medications) Persecutory voices/beliefs?:  (pt admits to hearing voices, unknown if persecutory) Depression: Yes Depression Symptoms: Despondent;Insomnia;Tearfulness;Isolating;Loss of interest in usual pleasures;Feeling worthless/self pity;Feeling angry/irritable Substance abuse history and/or treatment for substance abuse?: Yes Suicide prevention information given to non-admitted patients: Not applicable  Risk to Others Homicidal Ideation: Yes-Currently Present Thoughts of Harm to Others: Yes-Currently Present Comment - Thoughts of Harm to Others: Pt was staing "everyone is going to die" to his family Current Homicidal Intent: No-Not Currently/Within Last  6 Months Current Homicidal Plan: No-Not Currently/Within Last 6 Months Access to Homicidal Means: No Identified Victim: pt threatened his family at home History of harm to others?:  (Hx aggressive behavior) Assessment of Violence: On admission Violent Behavior Description: verbally aggressive toward family and ED staff Does patient have access to weapons?: No Criminal Charges Pending?: Yes Describe Pending Criminal Charges: [place]ons Does patient have a court date: Yes Court Date: 08/23/13  Psychosis Hallucinations: Auditory;Visual (Pt admits to this, but will not elaborate) Delusions: None noted  Mental Status Report Appear/Hygiene: Disheveled Eye Contact: Fair Motor Activity: Freedom of movement;Unremarkable Speech: Logical/coherent Level of Consciousness: Alert Mood: Depressed Affect: Depressed;Appropriate to circumstance Anxiety Level: Minimal Thought Processes: Coherent;Relevant Judgement: Impaired Orientation: Person;Place;Time;Situation Obsessive Compulsive Thoughts/Behaviors: None  Cognitive Functioning Concentration: Decreased Memory: Recent Intact;Remote Intact IQ: Average Insight: Poor Impulse Control: Poor Appetite: Fair Weight Loss: 0 Weight Gain: 0 Sleep: Decreased Total Hours of Sleep:  ("I can't sleep at night") Vegetative Symptoms: None  ADLScreening [facility] Assessment Services) Patient's cognitive ability adequate to safely complete daily activities?: Yes Patient able to express need for assistance with ADLs?: Yes Independently performs ADLs?: Yes (appropriate for developmental age)  Prior Inpatient Therapy Prior Inpatient Therapy: Yes Prior Therapy Dates: 24-Nov-2012 and prior  Prior Therapy Facilty/Provider(s): [facility] Reason for Treatment: SA/psychosis  Prior Outpatient Therapy Prior  Outpatient Therapy: Yes Prior Therapy Dates: 2014 and prior Prior Therapy Facilty/Provider(s): [facility] Reason for Treatment: Med mgnt  ADL Screening (condition at  time of admission) Patient's cognitive ability adequate to safely complete daily activities?: Yes Is the patient deaf or have difficulty hearing?: No Does the patient have difficulty seeing, even when wearing glasses/contacts?: No Does the patient have difficulty concentrating, remembering, or making decisions?: No Patient able to express need for assistance with ADLs?: Yes Does the patient have difficulty dressing or bathing?: No Independently performs ADLs?: Yes (appropriate for developmental age) Does the patient have difficulty walking or climbing stairs?: No  Home Assistive Devices/Equipment Home Assistive Devices/Equipment: None    Abuse/Neglect Assessment (Assessment to be complete while patient is alone) Physical Abuse: Denies Verbal Abuse: Denies Sexual Abuse: Denies Exploitation of patient/patient's resources: Denies Self-Neglect: Denies Values / Beliefs Cultural Requests During Hospitalization: None Spiritual Requests During Hospitalization: None Consults Spiritual Care Consult Needed: No Social Work Consult Needed: No Regulatory affairs officer (For Healthcare) Advance Directive: Patient does not have advance directive;Patient would not like information    Additional Information 1:1 In Past 12 Months?: No CIRT Risk: Yes Elopement Risk: Yes Does patient have medical clearance?: Yes     Disposition:  Disposition Initial Assessment Completed for this Encounter: Yes Disposition of Patient: Other dispositions Other disposition(s): Other (Comment) (Pt to be seen by extender at [facility] this AM)  Daisey Must 08/20/2013 10:28 AM

## 2013-08-20 NOTE — Consult Note (Signed)
[place] [facility] Consult   Reason for Consult:  IVC aggressive behavior toward family Referring Physician:  EDP  Colin Gamble is an 28 y.o. male.  Assessment: AXIS I:  Alcohol Abuse, Depressive Disorder NOS and Substance Induced Mood Disorder AXIS II:  Deferred AXIS III:   Past Medical History  Diagnosis Date  . Cancer   . Anxiety   . Depression    AXIS IV:  other psychosocial or environmental problems and problems related to social environment AXIS V:  11-20 some danger of hurting self or others possible OR occasionally fails to maintain minimal personal hygiene OR gross impairment in communication  Plan:  Recommend psychiatric Inpatient admission when medically cleared.  Subjective:   Colin Gamble is a 28 y.o. male patient admitted with Substance Induced Mood Disorder, Depressive Disorder, and Alcohol abuse.  HPI:  Patient states that he is unsure of how much he drinks but that it is not done on a daily basis.  "The most I drink might be 2 or 3 times a week" Patient was unsure of how much he would drink. Patient states that drinking wasn't his problem "I need help with depression.  I took Prozac when I was in prison and hadn't had none since I got out ."  Patient states that he was released from prison in 2008/10/30.  Patient denies suicidal ideation, homicidal ideation, psychosis, and paranoia.  Patient denies prior admission to inpatient psych hospital and outpatient treatment other than when in prison.  Patient states that he lives with his mother.  Patient also states that he is not interested in detox just getting help with his depression.  HPI Elements:   Location:  alcohol intoxication, aggressive behavior toward family. Quality:  ETOH 397. Severity:  Patient family afraid of patient. Aggressive behavior by patient . Timing:  4 years. Reviewed Family History Denies family history of mental illness Past Psychiatric History: Past Medical History  Diagnosis Date   . Cancer   . Anxiety   . Depression     reports that he has been smoking.  He does not have any smokeless tobacco history on file. He reports that he drinks about 3.6 ounces of alcohol per week. He reports that he does not use illicit drugs. No family history on file. Family History Substance Abuse:  (unknown) Family Supports:  (Mother, Aunt) Living Arrangements: Parent;Other relatives Can pt return to current living arrangement?: Yes Abuse/Neglect [facility]) Physical Abuse: Denies Verbal Abuse: Denies Sexual Abuse: Denies Allergies:  No Known Allergies  ACT Assessment Complete:  Yes:    Educational Status    Risk to Self: Risk to self Suicidal Ideation: No Suicidal Intent: No Is patient at risk for suicide?: No Suicidal Plan?: No Access to Means: No What has been your use of drugs/alcohol within the last 12 months?: pt admits to ETOH use, reported used cocaine last night Previous Attempts/Gestures: No How many times?: 0 Other Self Harm Risks: pt denies Triggers for Past Attempts: Other (Comment) (death of 56 year old son in 2009/10/30) Intentional Self Injurious Behavior: Damaging Comment - Self Injurious Behavior: ongoing SA Family Suicide History: Unknown Recent stressful life event(s): Conflict (Comment);Legal Issues;Recent negative physical changes;Turmoil (Comment);Other (Comment) (Depression, SA, Aggressive, Off of medications) Persecutory voices/beliefs?:  (pt admits to hearing voices, unknown if persecutory) Depression: Yes Depression Symptoms: Despondent;Insomnia;Tearfulness;Isolating;Loss of interest in usual pleasures;Feeling worthless/self pity;Feeling angry/irritable Substance abuse history and/or treatment for substance abuse?: Yes Suicide prevention information given to non-admitted patients: Not applicable  Risk  to Others: Risk to Others Homicidal Ideation: Yes-Currently Present Thoughts of Harm to Others: Yes-Currently Present Comment - Thoughts of Harm to Others: Pt  was staing "everyone is going to die" to his family Current Homicidal Intent: No-Not Currently/Within Last 6 Months Current Homicidal Plan: No-Not Currently/Within Last 6 Months Access to Homicidal Means: No Identified Victim: pt threatened his family at home History of harm to others?:  (Hx aggressive behavior) Assessment of Violence: On admission Violent Behavior Description: verbally aggressive toward family and ED staff Does patient have access to weapons?: No Criminal Charges Pending?: Yes Describe Pending Criminal Charges: [employer] Does patient have a court date: Yes Court Date: 08/23/13  Abuse: Abuse/Neglect Assessment (Assessment to be complete while patient is alone) Physical Abuse: Denies Verbal Abuse: Denies Sexual Abuse: Denies Exploitation of patient/patient's resources: Denies Self-Neglect: Denies  Prior Inpatient Therapy: Prior Inpatient Therapy Prior Inpatient Therapy: Yes Prior Therapy Dates: 2014 and prior  Prior Therapy Facilty/Provider(s): [facility] Reason for Treatment: SA/psychosis  Prior Outpatient Therapy: Prior Outpatient Therapy Prior Outpatient Therapy: Yes Prior Therapy Dates: 2014 and prior Prior Therapy Facilty/Provider(s): [facility] Reason for Treatment: Med mgnt  Additional Information: Additional Information 1:1 In Past 12 Months?: No CIRT Risk: Yes Elopement Risk: Yes Does patient have medical clearance?: Yes   Objective: Blood pressure 116/80, pulse 60, temperature 97.7 F (36.5 C), temperature source Oral, resp. rate 16, height _0  (1.651 m), weight 54.432 kg (120 lb), SpO2 97.00%.Body mass index is 19.97 kg/(m^2). Results for orders placed during the hospital encounter of 08/20/13 (from the past 72 hour(s))  ACETAMINOPHEN LEVEL     Status: None   Collection Time    08/20/13  4:14 AM      Result Value Range   Acetaminophen (Tylenol), Serum <15.0  10 - 30 ug/mL   Comment:            THERAPEUTIC CONCENTRATIONS VARY     SIGNIFICANTLY. A  RANGE OF 10-30     ug/mL MAY BE AN EFFECTIVE     CONCENTRATION FOR MANY PATIENTS.     HOWEVER, SOME ARE BEST TREATED     AT CONCENTRATIONS OUTSIDE THIS     RANGE.     ACETAMINOPHEN CONCENTRATIONS     >150 ug/mL AT 4 HOURS AFTER     INGESTION AND >50 ug/mL AT 12     HOURS AFTER INGESTION ARE     OFTEN ASSOCIATED WITH TOXIC     REACTIONS.  CBC     Status: None   Collection Time    08/20/13  4:14 AM      Result Value Range   WBC 9.2  4.0 - 10.5 K/uL   RBC 4.63  4.22 - 5.81 MIL/uL   Hemoglobin 15.1  13.0 - 17.0 g/dL   HCT 42.2  39.0 - 52.0 %   MCV 91.1  78.0 - 100.0 fL   MCH 32.6  26.0 - 34.0 pg   MCHC 35.8  30.0 - 36.0 g/dL   RDW 14.3  11.5 - 15.5 %   Platelets 260  150 - 400 K/uL  COMPREHENSIVE METABOLIC PANEL     Status: Abnormal   Collection Time    08/20/13  4:14 AM      Result Value Range   Sodium 145  137 - 147 mEq/L   Potassium 3.8  3.7 - 5.3 mEq/L   Chloride 104  96 - 112 mEq/L   CO2 22  19 - 32 mEq/L   Glucose, Bld 93  70 -  99 mg/dL   BUN 9  6 - 23 mg/dL   Creatinine, Ser 1.16  0.50 - 1.35 mg/dL   Calcium 8.9  8.4 - 10.5 mg/dL   Total Protein 8.1  6.0 - 8.3 g/dL   Albumin 4.6  3.5 - 5.2 g/dL   AST 27  0 - 37 U/L   ALT 13  0 - 53 U/L   Alkaline Phosphatase 68  39 - 117 U/L   Total Bilirubin 0.3  0.3 - 1.2 mg/dL   GFR calc non Af Amer 86 (*) >90 mL/min   GFR calc Af Amer >90  >90 mL/min   Comment: (NOTE)     The eGFR has been calculated using the CKD EPI equation.     This calculation has not been validated in all clinical situations.     eGFR's persistently <90 mL/min signify possible Chronic Kidney     Disease.  ETHANOL     Status: Abnormal   Collection Time    08/20/13  4:14 AM      Result Value Range   Alcohol, Ethyl (B) 397 (*) 0 - 11 mg/dL   Comment:            LOWEST DETECTABLE LIMIT FOR     SERUM ALCOHOL IS 11 mg/dL     FOR MEDICAL PURPOSES ONLY  SALICYLATE LEVEL     Status: Abnormal   Collection Time    08/20/13  4:14 AM      Result Value  Range   Salicylate Lvl <2.8 (*) 2.8 - 20.0 mg/dL  URINE RAPID DRUG SCREEN (HOSP PERFORMED)     Status: Abnormal   Collection Time    08/20/13  5:35 AM      Result Value Range   Opiates NONE DETECTED  NONE DETECTED   Cocaine POSITIVE (*) NONE DETECTED   Benzodiazepines NONE DETECTED  NONE DETECTED   Amphetamines NONE DETECTED  NONE DETECTED   Tetrahydrocannabinol NONE DETECTED  NONE DETECTED   Barbiturates NONE DETECTED  NONE DETECTED   Comment:            DRUG SCREEN FOR MEDICAL PURPOSES     ONLY.  IF CONFIRMATION IS NEEDED     FOR ANY PURPOSE, NOTIFY LAB     WITHIN 5 DAYS.                LOWEST DETECTABLE LIMITS     FOR URINE DRUG SCREEN     Drug Class       Cutoff (ng/mL)     Amphetamine      1000     Barbiturate      200     Benzodiazepine   786     Tricyclics       767     Opiates          300     Cocaine          300     THC              50   Labs are reviewed and are pertinent for ETOH, illicit drug use and other medical criteria. Medication review:  Will start Prozac 20 mg daily.  Patient states that medication helped in the past with his depression.  Current Facility-Administered Medications  Medication Dose Route Frequency Provider Last Rate Last Dose  . alum & mag hydroxide-simeth (MAALOX/MYLANTA) 200-200-20 MG/5ML suspension 30 mL  30 mL Oral PRN Remer Macho, PA-C      .  ibuprofen (ADVIL,MOTRIN) tablet 600 mg  600 mg Oral Q8H PRN Remer Macho, PA-C      . LORazepam (ATIVAN) tablet 1 mg  1 mg Oral Q8H PRN Arville Lime Schinlever, PA-C      . nicotine (NICODERM CQ - dosed in mg/24 hours) patch 21 mg  21 mg Transdermal Daily Catherine E Schinlever, PA-C      . ondansetron (ZOFRAN) tablet 4 mg  4 mg Oral Q8H PRN Remer Macho, PA-C       No current outpatient prescriptions on file.    Psychiatric Specialty Exam:     Blood pressure 116/80, pulse 60, temperature 97.7 F (36.5 C), temperature source Oral, resp. rate 16, height _0   (1.651 m), weight 54.432 kg (120 lb), SpO2 97.00%.Body mass index is 19.97 kg/(m^2).  General Appearance: Casual  Eye Contact::  Good  Speech:  Clear and Coherent and Normal Rate  Volume:  Normal  Mood:  Anxious and Depressed  Affect:  Congruent and Depressed  Thought Process:  Circumstantial and Goal Directed  Orientation:  Full (Time, Place, and Person)  Thought Content:  "I need help with depression"  Suicidal Thoughts:  No  Homicidal Thoughts:  No  Memory:  Immediate;   Good Recent;   Good  Judgement:  Poor  Insight:  Lacking  Psychomotor Activity:  Tremor  Concentration:  Fair  Recall:  Good  Akathisia:  No  Handed:  Right  AIMS (if indicated):     Assets:  Communication Skills Desire for Improvement Social Support  Sleep:      Face to face consult with Dr. Louretta Shorten Treatment Plan Summary: Daily contact with patient to assess and evaluate symptoms and progress in treatment Medication management  Recommendation:  Recommend inpatient treatment for alcohol detox and depression.  Start Prozac 20 mg QD, Monitor for safety and stabilization until inpatient treatment bed is found.  Earleen Newport FNP-BC 08/20/2013 4:38 PM  Patient was seen face to face for this evaluation and case discussed with physician extender. Reviewed the information documented by physician extender and agree with the treatment plan.  Brisia Schuermann,JANARDHAHA R. 08/21/2013 5:40 PM

## 2013-08-20 NOTE — ED Notes (Signed)
Patient took heart monitor off and was talking about leaving. [employer] and security called. Patient has IVC papers. Explained to patient about the IVC papers.

## 2013-08-20 NOTE — ED Notes (Signed)
Telepsych camera in the room with the patient.

## 2013-08-20 NOTE — ED Notes (Signed)
Pt is here IVC d/t mother fearing for her safety as well as the rest of the family.  Per IVC papers pt has stopped taking medications for bi-polar and has gotten increasingly aggressive with family.

## 2013-08-20 NOTE — ED Notes (Signed)
Pt placed in paper scrubs and security to bedside to wand pt.

## 2013-08-21 ENCOUNTER — Inpatient Hospital Stay (HOSPITAL_COMMUNITY)
Admission: AD | Admit: 2013-08-21 | Discharge: 2013-08-23 | DRG: 897 | Disposition: A | Discharge status: Home / Self Care | Payer: No Typology Code available for payment source | Source: Intra-hospital | Attending: Psychiatry | Admitting: Psychiatry

## 2013-08-21 ENCOUNTER — Encounter (HOSPITAL_COMMUNITY): Payer: Self-pay | Admitting: *Deleted

## 2013-08-21 DIAGNOSIS — F1010 Alcohol abuse, uncomplicated: Secondary | ICD-10-CM | POA: Diagnosis present

## 2013-08-21 DIAGNOSIS — F411 Generalized anxiety disorder: Secondary | ICD-10-CM | POA: Diagnosis present

## 2013-08-21 DIAGNOSIS — F3289 Other specified depressive episodes: Secondary | ICD-10-CM

## 2013-08-21 DIAGNOSIS — F1020 Alcohol dependence, uncomplicated: Principal | ICD-10-CM | POA: Diagnosis present

## 2013-08-21 DIAGNOSIS — F1910 Other psychoactive substance abuse, uncomplicated: Secondary | ICD-10-CM

## 2013-08-21 DIAGNOSIS — F1994 Other psychoactive substance use, unspecified with psychoactive substance-induced mood disorder: Secondary | ICD-10-CM

## 2013-08-21 DIAGNOSIS — Z79899 Other long term (current) drug therapy: Secondary | ICD-10-CM

## 2013-08-21 DIAGNOSIS — F329 Major depressive disorder, single episode, unspecified: Secondary | ICD-10-CM

## 2013-08-21 MED ORDER — LOPERAMIDE HCL 2 MG PO CAPS: 2.0000 mg | ORAL_CAPSULE | ORAL | Status: DC | PRN

## 2013-08-21 MED ORDER — TRAZODONE HCL 50 MG PO TABS
50.0000 mg | ORAL_TABLET | Freq: Every evening | ORAL | Status: DC | PRN
  Administered 2013-08-21 – 2013-08-22 (×2): 50 mg via ORAL
  Filled 2013-08-21 (×7): qty 1
  Filled 2013-08-21 (×2): qty 28
  Filled 2013-08-21 (×2): qty 1

## 2013-08-21 MED ORDER — INFLUENZA VAC SPLIT QUAD 0.5 ML IM SUSP
0.5000 mL | INTRAMUSCULAR | Status: AC
  Administered 2013-08-22: 0.5 mL via INTRAMUSCULAR
  Filled 2013-08-21: qty 0.5

## 2013-08-21 MED ORDER — ACETAMINOPHEN 325 MG PO TABS: 650.0000 mg | ORAL_TABLET | Freq: Four times a day (QID) | ORAL | Status: DC | PRN

## 2013-08-21 MED ORDER — CHLORDIAZEPOXIDE HCL 25 MG PO CAPS: 25.0000 mg | ORAL_CAPSULE | Freq: Four times a day (QID) | ORAL | Status: DC | PRN

## 2013-08-21 MED ORDER — CHLORDIAZEPOXIDE HCL 25 MG PO CAPS
25.0000 mg | ORAL_CAPSULE | Freq: Four times a day (QID) | ORAL | Status: AC
  Administered 2013-08-21 – 2013-08-23 (×6): 25 mg via ORAL
  Filled 2013-08-21 (×7): qty 1

## 2013-08-21 MED ORDER — THIAMINE HCL 100 MG/ML IJ SOLN: 100.0000 mg | Freq: Once | INTRAMUSCULAR | Status: DC

## 2013-08-21 MED ORDER — CHLORDIAZEPOXIDE HCL 25 MG PO CAPS
25.0000 mg | ORAL_CAPSULE | Freq: Three times a day (TID) | ORAL | Status: DC
  Administered 2013-08-23: 25 mg via ORAL
  Filled 2013-08-21: qty 1

## 2013-08-21 MED ORDER — ONDANSETRON 4 MG PO TBDP: 4.0000 mg | ORAL_TABLET | Freq: Four times a day (QID) | ORAL | Status: DC | PRN

## 2013-08-21 MED ORDER — CHLORDIAZEPOXIDE HCL 25 MG PO CAPS
25.0000 mg | ORAL_CAPSULE | ORAL | Status: DC
Start: 2013-08-24 — End: 2013-08-23

## 2013-08-21 MED ORDER — CHLORDIAZEPOXIDE HCL 25 MG PO CAPS: 25.0000 mg | ORAL_CAPSULE | Freq: Every day | ORAL | Status: DC

## 2013-08-21 MED ORDER — PNEUMOCOCCAL VAC POLYVALENT 25 MCG/0.5ML IJ INJ
0.5000 mL | INJECTION | INTRAMUSCULAR | Status: AC
  Administered 2013-08-22: 0.5 mL via INTRAMUSCULAR

## 2013-08-21 MED ORDER — ALUM & MAG HYDROXIDE-SIMETH 200-200-20 MG/5ML PO SUSP
30.0000 mL | ORAL | Status: DC | PRN
Start: 2013-08-21 — End: 2013-08-23

## 2013-08-21 MED ORDER — MAGNESIUM HYDROXIDE 400 MG/5ML PO SUSP: 30.0000 mL | Freq: Every day | ORAL | Status: DC | PRN

## 2013-08-21 MED ORDER — HYDROXYZINE HCL 25 MG PO TABS: 25.0000 mg | ORAL_TABLET | Freq: Four times a day (QID) | ORAL | Status: DC | PRN

## 2013-08-21 MED ORDER — ADULT MULTIVITAMIN W/MINERALS CH
1.0000 | ORAL_TABLET | Freq: Every day | ORAL | Status: DC
  Administered 2013-08-22 – 2013-08-23 (×2): 1 via ORAL
  Filled 2013-08-21 (×4): qty 1

## 2013-08-21 MED ORDER — VITAMIN B-1 100 MG PO TABS
100.0000 mg | ORAL_TABLET | Freq: Every day | ORAL | Status: DC
  Administered 2013-08-22 – 2013-08-23 (×2): 100 mg via ORAL
  Filled 2013-08-21 (×4): qty 1

## 2013-08-21 NOTE — Progress Notes (Signed)
Writer informed the nurse Argentina Donovan) that the patient has been accepted to [facility].  The nurse will arrange transportation with the [facility].

## 2013-08-21 NOTE — BHH Counselor (Signed)
Per Inocencio Homes at [facility], pt has been accepted by Dr. De Nurse to Dr. Sabra Heck to bed 300-1. Pt currently in 300-1 hasn't been d/c yet, so [facility] will call when bed is ready.   Arnold Long, [place] Assessment Counselor

## 2013-08-21 NOTE — ED Notes (Signed)
Called Report to [facility] and called [facility] transportation and left message for pt to be transported from Psych ED to [facility].

## 2013-08-21 NOTE — Progress Notes (Signed)
D: Patient in his room on approach.  Patient states he has not attended any groups today and he states he is ready to go home.  Patient states the only reason he is here is because of his mother.  Patient denies SI/HI and denies AVH.  Patient minimal and forward little but calm and cooperative. A: Staff to monitor Q 15 mins for safety.  Encouragement and support offered.  Scheduled medications administered per orders. R: Patient remains safe on the unit.  Patient attended group tonight.  Patient visible on the unit.  Patient taking administered medications.

## 2013-08-21 NOTE — ED Notes (Addendum)
Spoke with [facility] transport--instructed that [employer] would need to transport pt to [place], called and arranged for [employer] to transport pt to [facility].

## 2013-08-21 NOTE — ED Notes (Signed)
[employer] here to transport pt to [facility], 2 belonging bags sent with patient

## 2013-08-21 NOTE — Tx Team (Signed)
Initial Interdisciplinary Treatment Plan  PATIENT STRENGTHS: (choose at least two) Ability for insight Active sense of humor Average or above average intelligence Capable of independent living Communication skills General fund of knowledge Motivation for treatment/growth Physical Health Religious Affiliation Supportive family/friends Work skills  PATIENT STRESSORS: Financial difficulties Legal issue Loss of son in Sept 2011 Medication change or noncompliance Substance abuse   PROBLEM LIST: Problem List/Patient Goals Date to be addressed Date deferred Reason deferred Estimated date of resolution  "If possible try to get back on my medications" 08/21/13           Risk for suicide 08/21/13     Depression 08/21/13     Alcohol abuse 08/21/13                              DISCHARGE CRITERIA:  Ability to meet basic life and health needs Adequate post-discharge living arrangements Improved stabilization in mood, thinking, and/or behavior Medical problems require only outpatient monitoring Motivation to continue treatment in a less acute level of care Need for constant or close observation no longer present Reduction of life-threatening or endangering symptoms to within safe limits Safe-care adequate arrangements made Verbal commitment to aftercare and medication compliance Withdrawal symptoms are absent or subacute and managed without 24-hour nursing intervention  PRELIMINARY DISCHARGE PLAN: Attend 12-step recovery group Participate in family therapy Return to previous living arrangement  PATIENT/FAMIILY INVOLVEMENT: This treatment plan has been presented to and reviewed with the patient, Colin Gamble, and/or family member.  The patient and family have been given the opportunity to ask questions and make suggestions.  Wynonia Hazard [facility] 08/21/2013, 8:44 PM
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
## 2013-08-22 DIAGNOSIS — F319 Bipolar disorder, unspecified: Secondary | ICD-10-CM

## 2013-08-22 DIAGNOSIS — F1420 Cocaine dependence, uncomplicated: Secondary | ICD-10-CM

## 2013-08-22 DIAGNOSIS — F1020 Alcohol dependence, uncomplicated: Principal | ICD-10-CM

## 2013-08-22 DIAGNOSIS — F1220 Cannabis dependence, uncomplicated: Secondary | ICD-10-CM

## 2013-08-22 MED ORDER — CARBAMAZEPINE 200 MG PO TABS
200.0000 mg | ORAL_TABLET | Freq: Every day | ORAL | Status: DC
  Administered 2013-08-22: 200 mg via ORAL
  Filled 2013-08-22 (×3): qty 1
  Filled 2013-08-22: qty 14

## 2013-08-22 MED ORDER — BENZTROPINE MESYLATE 0.5 MG PO TABS
0.5000 mg | ORAL_TABLET | Freq: Every day | ORAL | Status: DC | PRN
  Filled 2013-08-22: qty 14

## 2013-08-22 MED ORDER — HALOPERIDOL 2 MG PO TABS
2.0000 mg | ORAL_TABLET | Freq: Every day | ORAL | Status: DC
  Administered 2013-08-22: 2 mg via ORAL
  Filled 2013-08-22 (×2): qty 1
  Filled 2013-08-22: qty 14
  Filled 2013-08-22: qty 1

## 2013-08-22 NOTE — [facility] Suicide Risk Assessment (Signed)
Suicide Risk Assessment  Admission Assessment     Nursing information obtained from:  Patient Demographic factors:  Male;Adolescent or young adult;Low socioeconomic status Current Mental Status:  NA Loss Factors:  Legal issues;Financial problems / change in socioeconomic status;Loss of significant relationship Historical Factors:  Family history of mental illness or substance abuse;Impulsivity;Domestic violence in family of origin Risk Reduction Factors:  Sense of responsibility to family;Religious beliefs about death;Living with another person, especially a relative;Positive social support  CLINICAL FACTORS:   Severe Anxiety and/or Agitation Alcohol/Substance Abuse/Dependencies Unstable or Poor Therapeutic Relationship  COGNITIVE FEATURES THAT CONTRIBUTE TO RISK:  Closed-mindedness Polarized thinking    SUICIDE RISK:   Minimal: No identifiable suicidal ideation.  Patients presenting with no risk factors but with morbid ruminations; may be classified as minimal risk based on the severity of the depressive symptoms  PLAN OF CARE:1. Admit for crisis management and stabilization. 2. Medication management to reduce current symptoms to base line and improve the     patient's overall level of functioning 3. Treat health problems as indicated. 4. Develop treatment plan to decrease risk of relapse upon discharge and the need for     readmission. 5. Psycho-social education regarding relapse prevention and self care. 6. Health care follow up as needed for medical problems. 7. Restart home medications where appropriate.   I certify that inpatient services furnished can reasonably be expected to improve the patient's condition.  Brandace Cargle,MD 08/22/2013, 10:52 AM

## 2013-08-22 NOTE — Progress Notes (Signed)
Patient ID: Colin Gamble, male   DOB: 27-Nov-1985, 27 y.o.   MRN: [id number] He has been  In bed today stating that he was tired.He denies SI thoughts.
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
## 2013-08-22 NOTE — Clinical Social Work Note (Signed)
Per pt request, CSW faxed letter to [facility] clerk of court stating that pt is in hospital and unable to attend court tomorrow 08/23/13. Fax and fax confirmation given to pt for his records. Pt signed ROI consent.    [place], [place]  08/22/2013 10:47 AM

## 2013-08-22 NOTE — H&P (Signed)
Psychiatric Admission Assessment Adult  Patient Identification:  Colin Gamble  Date of Evaluation:  08/22/2013  Chief Complaint:  ALCOHOL DEPENDENCE COCAINE DEPENDENCE BIPOLAR DISORDER NOS  History of Present Illness: This is an admission assessment for this 28 year old African-American male. This is his first admission to this hospital. Admitted to [facility] from the [facility] ED with complaints of verbal aggressiveness and bing drinking of alcohol. Colin Gamble reports, "I went to the hospital last Saturday night. Two cops escorted me to the hospital. They said my mother had called them. I just don't remember why or what happened. All I remember was that I had had a hair cut last Saturday, spent sometime with my cousins. We drank some alcohol and smoked some weed, and they took me home. That was the last thing that I remember. But, a nurse told me here that my mama said she was scared of me. I don't understand that because my mama and I were close. I suffer from bipolar depression, diagnosed in 2011. I was taken Prozac 20 mg daily. But I stopped taking my medicines about 2 years ago. I drink alcohol and smoke weed since I was 28 years old. I hear voices and my mood flips too. I got mad very easily, especially when drunk".  Elements:  Location:  Binge drinking, cocaine abuse, Hx bipolar disorder. Quality:  Location:  Verbal aggressives, binge drinking, cocaine use.. Severity:  Severe, mother has to call the cops. Timing:  Happened last Saturday night. Duration:  Chronic. Context:  Mr. has per hx has bipolar disorder, stopped taking medication, abusing alcohol and drugs,gets aggressive when drunk.  Associated Signs/Synptoms: Depression Symptoms:  depressed mood, psychomotor agitation, anxiety,  (Hypo) Manic Symptoms:  Hallucinations, Impulsivity, Irritable Mood, Labiality of Mood,  Anxiety Symptoms:  Excessive Worry,  Psychotic Symptoms:  Hallucinations: Auditory  PTSD Symptoms: Had  a traumatic exposure:  Denies  Psychiatric Specialty Exam: Physical Exam  Constitutional: He is oriented to person, place, and time. He appears well-developed.  HENT:  Head: Normocephalic.  Eyes: Pupils are equal, round, and reactive to light.  Neck: Normal range of motion.  Cardiovascular: Normal rate.   Respiratory: Effort normal.  GI: Soft.  Genitourinary:  Did not assess.  Musculoskeletal: Normal range of motion.  Neurological: He is alert and oriented to person, place, and time.  Skin: Skin is warm.  Psychiatric: His speech is normal and behavior is normal. Thought content normal. His mood appears anxious (Rated #5). Cognition and memory are normal. He expresses impulsivity. Depressed: Rated #5.    Review of Systems  Constitutional: Negative.   HENT: Negative.   Eyes: Negative.   Respiratory: Negative.   Cardiovascular: Negative.   Gastrointestinal: Negative.   Genitourinary: Negative.   Skin: Negative for itching and rash.       Multiple tattoos  Neurological: Negative.   Endo/Heme/Allergies: Negative.   Psychiatric/Behavioral: Positive for depression (Rated #5), hallucinations (Auditory, when drunk) and substance abuse (Alcoholism, Cocaine, cannabis abuse). Negative for suicidal ideas and memory loss. The patient is nervous/anxious (Rated #5). The patient does not have insomnia.     Blood pressure 118/84, pulse 64, temperature 97.7 F (36.5 C), temperature source Oral, resp. rate 16, height 5' 5" (1.651 m), weight 53.071 kg (117 lb).Body mass index is 19.47 kg/(m^2).  General Appearance: Disheveled, missing multiple teeth  Eye Contact::  Fair  Speech:  Clear and Coherent  Volume:  Normal  Mood:  Anxious and Depressed  Affect:  Flat  Thought  Process:  Coherent and Intact  Orientation:  Full (Time, Place, and Person)  Thought Content:  Hallucinations: Auditory and Rumination  Suicidal Thoughts:  No  Homicidal Thoughts:  No  Memory:  Immediate;   Good Recent;    Good Remote;   Good  Judgement:  Impaired  Insight:  Lacking  Psychomotor Activity:  Normal  Concentration:  Fair  Recall:  Good  Akathisia:  No  Handed:  Right  AIMS (if indicated):     Assets:  Desire for Improvement  Sleep:  Number of Hours: 5.5    Past Psychiatric History: Diagnosis: Alcohol dependence, Bipolar affective disorder  Hospitalizations: [facility]  Outpatient Care: "I don't remember, no more"  Substance Abuse Care: None reported  Self-Mutilation: Denies  Suicidal Attempts: Denies attempts and or thoughts  Violent Behaviors: Denies   Past Medical History:   Past Medical History  Diagnosis Date  . Anxiety   . Depression   . Cancer     at birth to apprx 4   None.  Allergies:  No Known Allergies  PTA Medications: No prescriptions prior to admission    Previous Psychotropic Medications:  Medication/Dose  Prozac 20 mg daily per [facility]'s reports               Substance Abuse History in the last 12 months:  yes  Consequences of Substance Abuse: Medical Consequences:  Liver damage, Possible death by overdose Legal Consequences:  Arrests, jail time, Loss of driving privilege. Family Consequences:  Family discord, divorce and or separation.  Social History:  reports that he has been smoking Cigarettes.  He has a 7 pack-year smoking history. He does not have any smokeless tobacco history on file. He reports that he drinks about 3.6 ounces of alcohol per week. He reports that he does not use illicit drugs. 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  Legal History: None reported  Hobbies/Interests: None reported  Family History:  History reviewed. No pertinent family history.  Results for orders placed during the hospital encounter of 08/20/13 (from the past 72 hour(s))  ACETAMINOPHEN LEVEL     Status: None   Collection Time    08/20/13  4:14 AM      Result Value Range   Acetaminophen (Tylenol), Serum <15.0  10 - 30 ug/mL   Comment:            THERAPEUTIC CONCENTRATIONS VARY     SIGNIFICANTLY. A RANGE OF 10-30     ug/mL MAY BE AN EFFECTIVE     CONCENTRATION FOR MANY PATIENTS.     HOWEVER, SOME ARE BEST TREATED     AT CONCENTRATIONS OUTSIDE THIS     RANGE.     ACETAMINOPHEN CONCENTRATIONS     >150 ug/mL AT 4 HOURS AFTER     INGESTION AND >50 ug/mL AT 12     HOURS AFTER INGESTION ARE     OFTEN ASSOCIATED WITH TOXIC     REACTIONS.  CBC  Status: None   Collection Time    08/20/13  4:14 AM      Result Value Range   WBC 9.2  4.0 - 10.5 K/uL   RBC 4.63  4.22 - 5.81 MIL/uL   Hemoglobin 15.1  13.0 - 17.0 g/dL   HCT 42.2  39.0 - 52.0 %   MCV 91.1  78.0 - 100.0 fL   MCH 32.6  26.0 - 34.0 pg   MCHC 35.8  30.0 - 36.0 g/dL   RDW 14.3  11.5 - 15.5 %   Platelets 260  150 - 400 K/uL  COMPREHENSIVE METABOLIC PANEL     Status: Abnormal   Collection Time    08/20/13  4:14 AM      Result Value Range   Sodium 145  137 - 147 mEq/L   Potassium 3.8  3.7 - 5.3 mEq/L   Chloride 104  96 - 112 mEq/L   CO2 22  19 - 32 mEq/L   Glucose, Bld 93  70 - 99 mg/dL   BUN 9  6 - 23 mg/dL   Creatinine, Ser 1.16  0.50 - 1.35 mg/dL   Calcium 8.9  8.4 - 10.5 mg/dL   Total Protein 8.1  6.0 - 8.3 g/dL   Albumin 4.6  3.5 - 5.2 g/dL   AST 27  0 - 37 U/L   ALT 13  0 - 53 U/L   Alkaline Phosphatase 68  39 - 117 U/L   Total Bilirubin 0.3  0.3 - 1.2 mg/dL   GFR  calc non Af Amer 86 (*) >90 mL/min   GFR calc Af Amer >90  >90 mL/min   Comment: (NOTE)     The eGFR has been calculated using the CKD EPI equation.     This calculation has not been validated in all clinical situations.     eGFR's persistently <90 mL/min signify possible Chronic Kidney     Disease.  ETHANOL     Status: Abnormal   Collection Time    08/20/13  4:14 AM      Result Value Range   Alcohol, Ethyl (B) 397 (*) 0 - 11 mg/dL   Comment:            LOWEST DETECTABLE LIMIT FOR     SERUM ALCOHOL IS 11 mg/dL     FOR MEDICAL PURPOSES ONLY  SALICYLATE LEVEL     Status: Abnormal   Collection Time    08/20/13  4:14 AM      Result Value Range   Salicylate Lvl <6.9 (*) 2.8 - 20.0 mg/dL  URINE RAPID DRUG SCREEN (HOSP PERFORMED)     Status: Abnormal   Collection Time    08/20/13  5:35 AM      Result Value Range   Opiates NONE DETECTED  NONE DETECTED   Cocaine POSITIVE (*) NONE DETECTED   Benzodiazepines NONE DETECTED  NONE DETECTED   Amphetamines NONE DETECTED  NONE DETECTED   Tetrahydrocannabinol NONE DETECTED  NONE DETECTED   Barbiturates NONE DETECTED  NONE DETECTED   Comment:            DRUG SCREEN FOR MEDICAL PURPOSES     ONLY.  IF CONFIRMATION IS NEEDED     FOR ANY PURPOSE, NOTIFY LAB     WITHIN 5 DAYS.                LOWEST DETECTABLE LIMITS     FOR URINE DRUG SCREEN     Drug Class  Cutoff (ng/mL)     Amphetamine      1000     Barbiturate      200     Benzodiazepine   735     Tricyclics       329     Opiates          300     Cocaine          300     THC              50   Psychological Evaluations:  Assessment:   DSM5: Schizophrenia Disorders:  NA Obsessive-Compulsive Disorders:  NA Trauma-Stressor Disorders:  NA Substance/Addictive Disorders:  Alcohol Related Disorder - Severe (303.90) and Cannabis Use Disorder - Severe (304.30), Cocaine dependence Depressive Disorders:  Bipolar affective disorder,   AXIS I:  Alcohol Related Disorder - Severe (303.90)  and Cannabis Use Disorder - Severe (304.30), Cocaine dependence, Bipolar affective disorder, AXIS II:  Deferred AXIS III:   Past Medical History  Diagnosis Date  . Anxiety   . Depression   . Cancer     at birth to apprx 4   AXIS IV:  other psychosocial or environmental problems and Polysubstance dependence, mental illness, chronic AXIS V:  21-30 behavior considerably influenced by delusions or hallucinations OR serious impairment in judgment, communication OR inability to function in almost all areas  Treatment Plan/Recommendations: 1. Admit for crisis management and stabilization, estimated length of stay 3-5 days.  2. Medication management to reduce current symptoms to base line and improve the patient's overall level of functioning; (a). Continue Librium detoxification, protocols already in progress.                                 (b). Initiate Tegretol 200 mg Q bedtime for mood stabilization,Bipolar disorder).                                 (c). Haldol 2 mg Q bedtime for mood control (hallucinations).                                 (d). Cogentin 0.5 mg daily prn for prevention of drug induced EPS.  3. Treat health problems as indicated.  4. Develop treatment plan to decrease risk of relapse upon discharge and the need for readmission.  5. Psycho-social education regarding relapse prevention and self care.  6. Health care follow up as needed for medical problems.  7. Review, reconcile, and reinstate any pertinent home medications for other health issues where appropriate. 8. Call for consults with hospitalist for any additional specialty patient care services as needed.  Treatment Plan Summary: Daily contact with patient to assess and evaluate symptoms and progress in treatment Medication management Supportive approach/coping skills/relapse prevention Detox as needed Reassess and address the co morbidities Current Medications:  Current Facility-Administered Medications   Medication Dose Route Frequency Provider Last Rate Last Dose  . acetaminophen (TYLENOL) tablet 650 mg  650 mg Oral Q6H PRN Laverle Hobby, PA-C      . alum & mag hydroxide-simeth (MAALOX/MYLANTA) 200-200-20 MG/5ML suspension 30 mL  30 mL Oral Q4H PRN Laverle Hobby, PA-C      . chlordiazePOXIDE (LIBRIUM) capsule 25 mg  25 mg Oral Q6H PRN Laverle Hobby, PA-C      .  chlordiazePOXIDE (LIBRIUM) capsule 25 mg  25 mg Oral QID Laverle Hobby, PA-C   25 mg at 08/22/13 0813   Followed by  . [START ON 08/23/2013] chlordiazePOXIDE (LIBRIUM) capsule 25 mg  25 mg Oral TID Laverle Hobby, PA-C       Followed by  . [START ON 08/24/2013] chlordiazePOXIDE (LIBRIUM) capsule 25 mg  25 mg Oral BH-qamhs Spencer E Simon, PA-C       Followed by  . [START ON 08/26/2013] chlordiazePOXIDE (LIBRIUM) capsule 25 mg  25 mg Oral Daily Laverle Hobby, PA-C      . hydrOXYzine (ATARAX/VISTARIL) tablet 25 mg  25 mg Oral Q6H PRN Laverle Hobby, PA-C      . influenza vac split quadrivalent PF (FLUARIX) injection 0.5 mL  0.5 mL Intramuscular Tomorrow-1000 Nicholaus Bloom, MD      . loperamide (IMODIUM) capsule 2-4 mg  2-4 mg Oral PRN Laverle Hobby, PA-C      . magnesium hydroxide (MILK OF MAGNESIA) suspension 30 mL  30 mL Oral Daily PRN Laverle Hobby, PA-C      . multivitamin with minerals tablet 1 tablet  1 tablet Oral Daily Laverle Hobby, PA-C   1 tablet at 08/22/13 0813  . ondansetron (ZOFRAN-ODT) disintegrating tablet 4 mg  4 mg Oral Q6H PRN Laverle Hobby, PA-C      . pneumococcal 23 valent vaccine (PNU-IMMUNE) injection 0.5 mL  0.5 mL Intramuscular Tomorrow-1000 Nicholaus Bloom, MD      . thiamine (B-1) injection 100 mg  100 mg Intramuscular Once Laverle Hobby, PA-C      . thiamine (VITAMIN B-1) tablet 100 mg  100 mg Oral Daily Laverle Hobby, PA-C   100 mg at 08/22/13 0813  . traZODone (DESYREL) tablet 50 mg  50 mg Oral QHS,MR X 1 Spencer E Simon, PA-C   50 mg at 08/21/13 2240    Observation Level/Precautions:   15 minute checks  Laboratory:  Reviewed ED lab findings on file  Psychotherapy:  Group sessions  Medications: See medication lists   Consultations: As needed    Discharge Concerns: Sobriety   Estimated LOS: 2-4 days  Other:     I certify that inpatient services furnished can reasonably be expected to improve the patient's condition.   Lindell Spar I, PMHNP-BC 1/20/20159:43 AM Personally evaluated the patient, reviewed the physical exam and agree with assessment and plan Geralyn Flash A. Sabra Heck, M.D.

## 2013-08-22 NOTE — BHH Group Notes (Signed)
Adult Psychoeducational Group Note  Date:  08/22/2013 Time:  11:39 PM  Group Topic/Focus:  Wrap-Up Group:   The focus of this group is to help patients review their daily goal of treatment and discuss progress on daily workbooks.  Participation Level:  Active  Participation Quality:  Appropriate  Affect:  Appropriate  Cognitive:  Appropriate  Insight: Appropriate  Engagement in Group:  Engaged  Modes of Intervention:  Discussion  Additional Comments:  Dorrien stated that he suffers from depression and doesn't really get up out the bed.  He said when he does get up he's outgoing and likes to laugh.  Victorino Sparrow A 08/22/2013, 11:39 PM

## 2013-08-22 NOTE — Progress Notes (Signed)
Patient ID: Colin Gamble, male   DOB: [birth year], 28 y.o.   MRN: [id number]   Pt was pleasant and cooperative during the adm process. Stated that he "didn't know why his mom put him in here". Writer asked pt about the circumstances surrounding his adm. Pt stated he'd gone out drinking with his cousin, and doesn't remember what happened. Writer explained that per report pt's mother feared for her safety. Pt stated, "nah she knows I wouldn't have done anything to her. I just needed to go to sleep". Pt denied drug use during the assessment, but tested positive for cocaine. Pt admitted to alcohol use but minimized at times. Pt has hx of brain cancer as an infant, having brain surgery.

## 2013-08-22 NOTE — BHH Suicide Risk Assessment (Signed)
[place] INPATIENT: Family/Significant Other Suicide Prevention Education  Suicide Prevention Education:  Education Completed; No one has been identified by the patient as the family member/significant other with whom the patient will be residing, and identified as the person(s) who will aid the patient in the event of a mental health crisis (suicidal ideations/suicide attempt).   Pt did not c/o SI at admission, nor have they endorsed SI during their stay here. SPE not required. SPI pamphlet provided to pt and he was encouraged to share information with support network, ask questions, and talk about any concerns.   [place], [place] 08/22/2013 10:49 AM

## 2013-08-22 NOTE — Progress Notes (Signed)
Recreation Therapy Notes  Animal-Assisted Activity/Therapy (AAA/T) Program Checklist/Progress Notes Patient Eligibility Criteria Checklist & Daily Group note for Rec Tx Intervention  Date: 01.20.2015 Time: 2:45pm Location: [address] Dayroom    AAA/T Program Assumption of Risk Form signed by Patient/ or Parent Legal Guardian yes  Patient is free of allergies or sever asthma yes  Patient reports no fear of animals yes  Patient reports no history of cruelty to animals yes   Patient understands his/her participation is voluntary yes  Behavioral Response: Did not attend.   Colin Gamble L Colin Gamble, LRT/CTRS  Shaindel Sweeten L 08/22/2013 4:30 PM 

## 2013-08-22 NOTE — BHH Group Notes (Signed)
[place] LCSW Group Therapy  08/22/2013 1:38 PM  Type of Therapy:  Group Therapy  Participation Level:  Did Not Attend-pt in bed/refused to attend group.  Smart, Cay Kath LCSWA 08/22/2013, 1:38 PM

## 2013-08-23 DIAGNOSIS — F1994 Other psychoactive substance use, unspecified with psychoactive substance-induced mood disorder: Secondary | ICD-10-CM

## 2013-08-23 MED ORDER — HALOPERIDOL 2 MG PO TABS: 2.0000 mg | ORAL_TABLET | Freq: Every day | ORAL | Status: AC

## 2013-08-23 MED ORDER — TRAZODONE HCL 50 MG PO TABS: 50.0000 mg | ORAL_TABLET | Freq: Every evening | ORAL | Status: AC | PRN

## 2013-08-23 MED ORDER — CARBAMAZEPINE 200 MG PO TABS: 200.0000 mg | ORAL_TABLET | Freq: Every day | ORAL | Status: AC

## 2013-08-23 MED ORDER — BENZTROPINE MESYLATE 0.5 MG PO TABS: 0.5000 mg | ORAL_TABLET | Freq: Every day | ORAL | Status: AC | PRN

## 2013-08-23 NOTE — Progress Notes (Signed)
[facility] Adult Case Management Discharge Plan :  Will you be returning to the same living situation after discharge: Yes,  home with mother/aunt At discharge, do you have transportation home?:Yes,  family member coming after 1pm  Do you have the ability to pay for your medications:Yes,  mental health  Release of information consent forms completed and submitted to Medical Records by CSW. Patient to Follow up at: Follow-up Information   Follow up with [facility]. (Walk in between 8am-9am Monday through Friday for hospital follow-up/medication management/assessment for services. )    Contact information:   [address], [place] [address] Phone: [phone] Fax: [phone]      Patient denies SI/HI:   Yes,  during admission, group, and self report.     Safety Planning and Suicide Prevention discussed:  Yes,  SPE not required for this pt as he did not endorse SI during admission or during stay at [facility]. PT provided with SPI pamphlet and encouraged to share with his support network.   Smart, Diamonte Stavely LCSWA  08/23/2013, 10:41 AM

## 2013-08-23 NOTE — BHH Suicide Risk Assessment (Signed)
Suicide Risk Assessment  Discharge Assessment     Demographic Factors:  Male, Adolescent or young adult, Low socioeconomic status and Unemployed  Mental Status Per Nursing Assessment::   On Admission:  NA  Current Mental Status by Physician: Mental Status Examination: Patient appeared as per his stated age, casually dressed, and fairly groomed, and maintaining good eye contact. Patient has good mood and his affect was constricted. He has normal rate, rhythm, and volume of speech. His thought process is linear and goal directed. Patient has denied suicidal, homicidal ideations, intentions or plans. Patient has no evidence of auditory or visual hallucinations, delusions, and paranoia. Patient has fair insight judgment and impulse control.  Loss Factors: Financial problems/change in socioeconomic status  Historical Factors: Family history of mental illness or substance abuse and Impulsivity  Risk Reduction Factors:   Sense of responsibility to family, Religious beliefs about death, Living with another person, especially a relative, Positive social support, Positive therapeutic relationship and Positive coping skills or problem solving skills  Continued Clinical Symptoms:  Depression:   Recent sense of peace/wellbeing Alcohol/Substance Abuse/Dependencies Previous Psychiatric Diagnoses and Treatments  Cognitive Features That Contribute To Risk:  Polarized thinking    Suicide Risk:  Minimal: No identifiable suicidal ideation.  Patients presenting with no risk factors but with morbid ruminations; may be classified as minimal risk based on the severity of the depressive symptoms  Discharge Diagnoses:   AXIS I:  Alcohol Abuse and Substance Induced Mood Disorder AXIS II:  Deferred AXIS III:   Past Medical History  Diagnosis Date  . Anxiety   . Depression   . Cancer     at birth to apprx 4   AXIS IV:  economic problems, occupational problems, other psychosocial or environmental  problems, problems related to social environment and problems with primary support group AXIS V:  51-60 moderate symptoms  Plan Of Care/Follow-up recommendations:  Activity:  As tolerated Diet:  Regular  Is patient on multiple antipsychotic therapies at discharge:  No   Has Patient had three or more failed trials of antipsychotic monotherapy by history:  No  Recommended Plan for Multiple Antipsychotic Therapies: NA  Karion Cudd,JANARDHAHA R. 08/23/2013, 1:49 PM

## 2013-08-23 NOTE — Progress Notes (Signed)
Patient ID: Colin Gamble, male   DOB: March 04, 1986, 28 y.o.   MRN: [id number] D: patient discharged home per MD order.  Patient received all personal belongings, prescriptions and medication samples.  Patient appreciative of staff's assistance and completed a survey.  He denies SI/HI/AVH.  He states that he plans to follow up with [facility] and attend AA for his alcohol abuse.  He left ambulatory for the bus station.

## 2013-08-23 NOTE — BHH Group Notes (Signed)
[facility] LCSW Aftercare Discharge Planning Group Note   08/23/2013 9:35 AM  Participation Quality:  Appropriate   Mood/Affect:  Appropriate  Depression Rating:  1  Anxiety Rating:  1  Thoughts of Suicide:  No Will you contract for safety?   NA  Current AVH:  No  Plan for Discharge/Comments:  Pt reports that he is ready to d/c today. He experiences no withdrawal symptoms and reports no SI/HI/AVH. Pt plans to return home and follow up at [facility] for med management. He is still not interested in referral for therapy and does not like groups.   Transportation Means: family member   Supports: family Midwife, HeatherLCSWA

## 2013-08-23 NOTE — Tx Team (Signed)
Interdisciplinary Treatment Plan Update (Adult)  Date: 08/23/2013  Time Reviewed:10:24 AM  Progress in Treatment:  Attending groups: Yes  Participating in groups:  Minimally  Taking medication as prescribed: Yes  Tolerating medication: Yes  Family/Significant othe contact made: No. SPE not required for this pt.  Patient understands diagnosis: Yes, AEB seeking treatment for ETOH detox and mood stabilization.  Discussing patient identified problems/goals with staff: Yes  Medical problems stabilized or resolved: Yes  Denies suicidal/homicidal ideation: Yes  Patient has not harmed self or Others: Yes  New problem(s) identified:  Discharge Plan or Barriers: Pt to return home and follow up at [facility] for med management. He is not interested in therapy referral.  Additional comments: This is an admission assessment for this 28 year old African-American male. This is his first admission to this hospital. Admitted to [facility] from the [facility] ED with complaints of verbal aggressiveness and bing drinking of alcohol. Colin Gamble reports, "I went to the hospital last Saturday night. Two cops escorted me to the hospital. They said my mother had called them. I just don't remember why or what happened. All I remember was that I had had a hair cut last Saturday, spent sometime with my cousins. We drank some alcohol and smoked some weed, and they took me home. That was the last thing that I remember. But, a nurse told me here that my mama said she was scared of me. I don't understand that because my mama and I were close. I suffer from bipolar depression, diagnosed in 2011. I was taken Prozac 20 mg daily. But I stopped taking my medicines about 2 years ago. I drink alcohol and smoke weed since I was 28 years old. I hear voices and my mood flips too. I got mad very easily, especially when drunk". Reason for Continuation of Hospitalization: d/c today  Estimated length of stay:  D/c today For review of initial/current  patient goals, please see plan of care.  Attendees:  Patient:    Family:    Physician:    Nursing: Lorenda Peck 08/23/2013 10:24 AM   Clinical Social Worker [place], [place]  08/23/2013 10:24 AM   Other: Grayland Ormond, RN  08/23/2013 10:24 AM   Other: Gerline Legacy Nurse CM 08/23/2013 10:24 AM      Other:    Scribe for Treatment Team:  [place] LCSWA 08/23/2013 10:24 AM
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
## 2013-08-24 NOTE — Progress Notes (Signed)
Recreation Therapy Notes  Date: 01.21.2015 Time: 3:00pm Location: [address] Dayroom   Group Topic: Communication, Team Building, Problem Solving  Goal Area(s) Addresses:  Patient will effectively work with peer towards shared goal.  Patient will identify skill used to make activity successful.  Patient will identify how skills used during activity can be used to reach post d/c goals.   Behavioral Response: Did not attend.   Laureen Ochs Jelani Vreeland, LRT/CTRS  Tianah Lonardo L 08/24/2013 9:04 AM

## 2013-08-28 NOTE — Progress Notes (Signed)
Patient Discharge Instructions:  After Visit Summary (AVS):   Faxed to:  08/28/13 Discharge Summary Note:   Faxed to:  08/28/13 Psychiatric Admission Assessment Note:   Faxed to:  08/28/13 Suicide Risk Assessment - Discharge Assessment:   Faxed to:  08/28/13 Faxed/Sent to the Next Level Care provider:  08/28/13 Faxed to [facility], 08/28/2013, 4:23 PM

## 2015-03-08 ENCOUNTER — Encounter (HOSPITAL_COMMUNITY): Payer: Self-pay | Admitting: *Deleted

## 2015-03-08 ENCOUNTER — Emergency Department (HOSPITAL_COMMUNITY)
Admission: EM | Admit: 2015-03-08 | Discharge: 2015-03-08 | Disposition: A | Discharge status: Home / Self Care | Payer: No Typology Code available for payment source | Attending: Emergency Medicine | Admitting: Emergency Medicine

## 2015-03-08 ENCOUNTER — Emergency Department (HOSPITAL_COMMUNITY): Payer: No Typology Code available for payment source

## 2015-03-08 DIAGNOSIS — Y998 Other external cause status: Secondary | ICD-10-CM | POA: Insufficient documentation

## 2015-03-08 DIAGNOSIS — F329 Major depressive disorder, single episode, unspecified: Secondary | ICD-10-CM | POA: Insufficient documentation

## 2015-03-08 DIAGNOSIS — Y9389 Activity, other specified: Secondary | ICD-10-CM | POA: Diagnosis not present

## 2015-03-08 DIAGNOSIS — Z79899 Other long term (current) drug therapy: Secondary | ICD-10-CM | POA: Diagnosis not present

## 2015-03-08 DIAGNOSIS — Z859 Personal history of malignant neoplasm, unspecified: Secondary | ICD-10-CM | POA: Insufficient documentation

## 2015-03-08 DIAGNOSIS — M79641 Pain in right hand: Secondary | ICD-10-CM

## 2015-03-08 DIAGNOSIS — Z720 Tobacco use: Secondary | ICD-10-CM | POA: Insufficient documentation

## 2015-03-08 DIAGNOSIS — S6991XA Unspecified injury of right wrist, hand and finger(s), initial encounter: Secondary | ICD-10-CM | POA: Diagnosis not present

## 2015-03-08 DIAGNOSIS — Y92410 Unspecified street and highway as the place of occurrence of the external cause: Secondary | ICD-10-CM | POA: Insufficient documentation

## 2015-03-08 DIAGNOSIS — F419 Anxiety disorder, unspecified: Secondary | ICD-10-CM | POA: Diagnosis not present

## 2015-03-08 MED ORDER — KETOROLAC TROMETHAMINE 30 MG/ML IJ SOLN
30.0000 mg | Freq: Once | INTRAMUSCULAR | Status: AC
  Administered 2015-03-08: 30 mg via INTRAMUSCULAR

## 2015-03-08 MED ORDER — KETOROLAC TROMETHAMINE 30 MG/ML IJ SOLN
30.0000 mg | Freq: Once | INTRAMUSCULAR | Status: DC
  Filled 2015-03-08: qty 1

## 2015-03-08 MED ORDER — OXYCODONE-ACETAMINOPHEN 5-325 MG PO TABS: 2.0000 | ORAL_TABLET | Freq: Once | ORAL | Status: DC
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
## 2015-03-08 NOTE — ED Notes (Signed)
Patient returned from X-ray 

## 2015-03-08 NOTE — Discharge Instructions (Signed)
Musculoskeletal Pain Take ibuprofen for pain. Follow-up with a provider using the resource guide below. Rest. Ice. Elevate. Musculoskeletal pain is muscle and boney aches and pains. These pains can occur in any part of the body. Your caregiver may treat you without knowing the cause of the pain. They may treat you if blood or urine tests, X-rays, and other tests were normal.  CAUSES There is often not a definite cause or reason for these pains. These pains may be caused by a type of germ (virus). The discomfort may also come from overuse. Overuse includes working out too hard when your body is not fit. Boney aches also come from weather changes. Bone is sensitive to atmospheric pressure changes. HOME CARE INSTRUCTIONS   Ask when your test results will be ready. Make sure you get your test results.  Only take over-the-counter or prescription medicines for pain, discomfort, or fever as directed by your caregiver. If you were given medications for your condition, do not drive, operate machinery or power tools, or sign legal documents for 24 hours. Do not drink alcohol. Do not take sleeping pills or other medications that may interfere with treatment.  Continue all activities unless the activities cause more pain. When the pain lessens, slowly resume normal activities. Gradually increase the intensity and duration of the activities or exercise.  During periods of severe pain, bed rest may be helpful. Lay or sit in any position that is comfortable.  Putting ice on the injured area.  Put ice in a bag.  Place a towel between your skin and the bag.  Leave the ice on for 15 to 20 minutes, 3 to 4 times a day.  Follow up with your caregiver for continued problems and no reason can be found for the pain. If the pain becomes worse or does not go away, it may be necessary to repeat tests or do additional testing. Your caregiver may need to look further for a possible cause. SEEK IMMEDIATE MEDICAL CARE  IF:  You have pain that is getting worse and is not relieved by medications.  You develop chest pain that is associated with shortness or breath, sweating, feeling sick to your stomach (nauseous), or throw up (vomit).  Your pain becomes localized to the abdomen.  You develop any new symptoms that seem different or that concern you. MAKE SURE YOU:   Understand these instructions.  Will watch your condition.  Will get help right away if you are not doing well or get worse. Document Released: 07/20/2005 Document Revised: 10/12/2011 Document Reviewed: 03/24/2013 [facility] Patient Information 2015 [place], [place]. This information is not intended to replace advice given to you by your health care provider. Make sure you discuss any questions you have with your health care provider.
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
## 2016-07-01 ENCOUNTER — Encounter (HOSPITAL_COMMUNITY): Payer: Self-pay | Admitting: Family Medicine

## 2016-07-01 ENCOUNTER — Emergency Department (HOSPITAL_COMMUNITY)
Admission: EM | Admit: 2016-07-01 | Discharge: 2016-07-01 | Disposition: A | Discharge status: Home / Self Care | Payer: Self-pay | Attending: Emergency Medicine | Admitting: Emergency Medicine

## 2016-07-01 DIAGNOSIS — F17210 Nicotine dependence, cigarettes, uncomplicated: Secondary | ICD-10-CM | POA: Insufficient documentation

## 2016-07-01 DIAGNOSIS — B86 Scabies: Secondary | ICD-10-CM | POA: Insufficient documentation

## 2016-07-01 MED ORDER — PERMETHRIN 5 % EX CREA: TOPICAL_CREAM | CUTANEOUS | 0 refills | Status: AC

## 2016-07-01 NOTE — ED Notes (Signed)
Pt comfortable with discharge and follow up instructions. Pt declines wheelchair, escorted to waiting area by this RN. Rx x1 
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
## 2016-07-01 NOTE — ED Triage Notes (Signed)
PT presents from home via POV with c/o rash x3mos. Visited family about 58mos ago and stayed in a different home and says it started then.  Areas affected are bilateral AC, wrists, between fingers, and BLE. He describes rash as itchy.

## 2016-07-01 NOTE — ED Provider Notes (Signed)
[facility] DEPT Provider Note   CSN: [id number] Arrival date & time: 07/01/16  [id number]  By signing my name below, I, Sonum Patel, attest that this documentation has been prepared under the direction and in the presence of Shary Decamp, PA-C. Electronically Signed: Sonum Patel, Education administrator. 07/01/16. 9:31 AM.  History   Chief Complaint Chief Complaint  Patient presents with  . Rash    The history is provided by the patient. No language interpreter was used.     HPI Comments: Colin Gamble is a 30 y.o. male who presents to the Emergency Department complaining of gradual onset, constant, gradually worsened pruritic rash for the past 2 months. He states the rash progressed to BUE and BLE along with in between the fingers and toes. He reports using a blanket that was in storage without washing it first. Notes itching with minimal pain.  He denies fever, nausea, vomiting, diarrhea. He denies associated pain to the affected areas.   Past Medical History:  Diagnosis Date  . Anxiety   . Cancer ([place])    at birth to apprx 4  . Depression     Patient Active Problem List   Diagnosis Date Noted  . Alcohol abuse 08/20/2013  . Depressive disorder 08/20/2013  . Aggressive behavior 08/20/2013  . Substance induced mood disorder ([place]) 08/20/2013    Past Surgical History:  Procedure Laterality Date  . BRAIN SURGERY     as a baby       Home Medications    Prior to Admission medications   Medication Sig Start Date End Date Taking? Authorizing Provider  benztropine (COGENTIN) 0.5 MG tablet Take 1 tablet (0.5 mg total) by mouth daily as needed for tremors. 08/23/13   Encarnacion Slates, NP  carbamazepine (TEGRETOL) 200 MG tablet Take 1 tablet (200 mg total) by mouth daily at 8 pm. For mood stabilization 08/23/13   Encarnacion Slates, NP  haloperidol (HALDOL) 2 MG tablet Take 1 tablet (2 mg total) by mouth daily at 8 pm. For mood control 08/23/13   Encarnacion Slates, NP  traZODone (DESYREL) 50 MG tablet Take  1 tablet (50 mg total) by mouth at bedtime and may repeat dose one time if needed. For sleep 08/23/13   Encarnacion Slates, NP    Family History No family history on file.  Social History Social History  Substance Use Topics  . Smoking status: Current Every Day Smoker    Packs/day: 0.50    Years: 14.00    Types: Cigarettes  . Smokeless tobacco: Not on file  . Alcohol use 3.6 oz/week    6 Cans of beer per week     Comment: twice weekly     Allergies   Patient has no known allergies.   Review of Systems Review of Systems  Constitutional: Negative for fever.  Gastrointestinal: Negative for diarrhea, nausea and vomiting.  Skin: Positive for rash.     Physical Exam Updated Vital Signs BP 125/82 (BP Location: Right Arm)   Pulse 65   Temp 98.2 F (36.8 C) (Oral)   Resp 18   Ht 5\' 5"  (1.651 m)   Wt 125 lb (56.7 kg)   SpO2 100%   BMI 20.80 kg/m   Physical Exam  Constitutional: He is oriented to person, place, and time. He appears well-developed and well-nourished.  HENT:  Head: Normocephalic and atraumatic.  Cardiovascular: Normal rate.   Pulmonary/Chest: Effort normal.  Neurological: He is alert and oriented to person, place, and time.  Skin: Skin is warm and dry. Lesion noted.  Scattered 0.5 cm lesions noted in between digits up towards elbow. Also BLE has mild excoriations. No signs of infection. Consistent with scabies.  Psychiatric: He has a normal mood and affect.  Nursing note and vitals reviewed.  ED Treatments / Results  DIAGNOSTIC STUDIES: Oxygen Saturation is 100% on RA, normal by my interpretation.    COORDINATION OF CARE: 9:41 AM Discussed treatment plan with pt at bedside and pt agreed to plan.   Labs (all labs ordered are listed, but only abnormal results are displayed) Labs Reviewed - No data to display  EKG  EKG Interpretation None       Radiology No results found.  Procedures Procedures (including critical care time)  Medications  Ordered in ED Medications - No data to display   Initial Impression / Assessment and Plan / ED Course  I have reviewed the triage vital signs and the nursing notes.  Pertinent labs & imaging results that were available during my care of the patient were reviewed by me and considered in my medical decision making (see chart for details).  Clinical Course    Final Clinical Impressions(s) / ED Diagnoses     I have reviewed the relevant previous healthcare records.  I obtained HPI from historian.   ED Course:  Assessment: Discussed diagnosis & treatment of scabies with patient.  They have been advised to followup with her primary care doctor 2 weeks after treatment.  They have also been advised to clean entire household including washing sheets in the hottest water possible and using R.I.D. spray in the car and on sofa. Discussed the proper application of permethrin cream.  Patient  advised to repeat treatment in one week. No signs of secondary infection.  Discussed return precautions. Patient advised to follow up with PCP for unresolved symptoms. Patient appears safe for discharge.   Disposition/Plan:  DC Home Additional Verbal discharge instructions given and discussed with patient.  Pt Instructed to f/u with PCP in the next week for evaluation and treatment of symptoms. Return precautions given Pt acknowledges and agrees with plan  Supervising Physician Gareth Morgan, MD  Final diagnoses:  Scabies    New Prescriptions New Prescriptions   PERMETHRIN (ELIMITE) 5 % CREAM    Apply to affected area once. Leave 8-14 hours and wash off   I personally performed the services described in this documentation, which was scribed in my presence. The recorded information has been reviewed and is accurate.    Shary Decamp, PA-C 07/01/16 0945    Gareth Morgan, MD 07/01/16 1734

## 2017-03-31 ENCOUNTER — Emergency Department (HOSPITAL_COMMUNITY): Payer: Self-pay

## 2017-03-31 ENCOUNTER — Encounter (HOSPITAL_COMMUNITY): Payer: Self-pay | Admitting: Emergency Medicine

## 2017-03-31 ENCOUNTER — Emergency Department (HOSPITAL_COMMUNITY)
Admission: EM | Admit: 2017-03-31 | Discharge: 2017-03-31 | Disposition: A | Discharge status: Home / Self Care | Payer: Self-pay | Attending: Emergency Medicine | Admitting: Emergency Medicine

## 2017-03-31 DIAGNOSIS — F17210 Nicotine dependence, cigarettes, uncomplicated: Secondary | ICD-10-CM | POA: Insufficient documentation

## 2017-03-31 DIAGNOSIS — S0081XA Abrasion of other part of head, initial encounter: Secondary | ICD-10-CM

## 2017-03-31 DIAGNOSIS — Y929 Unspecified place or not applicable: Secondary | ICD-10-CM | POA: Insufficient documentation

## 2017-03-31 DIAGNOSIS — S0181XA Laceration without foreign body of other part of head, initial encounter: Secondary | ICD-10-CM

## 2017-03-31 DIAGNOSIS — Y9389 Activity, other specified: Secondary | ICD-10-CM | POA: Insufficient documentation

## 2017-03-31 DIAGNOSIS — Y999 Unspecified external cause status: Secondary | ICD-10-CM | POA: Insufficient documentation

## 2017-03-31 DIAGNOSIS — F10920 Alcohol use, unspecified with intoxication, uncomplicated: Secondary | ICD-10-CM

## 2017-03-31 DIAGNOSIS — W1789XA Other fall from one level to another, initial encounter: Secondary | ICD-10-CM | POA: Insufficient documentation

## 2017-03-31 DIAGNOSIS — Z79899 Other long term (current) drug therapy: Secondary | ICD-10-CM | POA: Insufficient documentation

## 2017-03-31 DIAGNOSIS — R51 Headache: Secondary | ICD-10-CM | POA: Insufficient documentation

## 2017-03-31 LAB — BASIC METABOLIC PANEL
Anion gap: 13 (ref 5–15)
BUN: <5 mg/dL — ABNORMAL LOW (ref 6–20)
CHLORIDE: 108 mmol/L (ref 101–111)
CO2: 21 mmol/L — ABNORMAL LOW (ref 22–32)
CREATININE: 0.96 mg/dL (ref 0.61–1.24)
Calcium: 8.3 mg/dL — ABNORMAL LOW (ref 8.9–10.3)
GFR calc non Af Amer: >60 mL/min (ref >60)
Glucose, Bld: 117 mg/dL — ABNORMAL HIGH (ref 65–99)
Potassium: 3.7 mmol/L (ref 3.5–5.1)
Sodium: 142 mmol/L (ref 135–145)

## 2017-03-31 LAB — CBC WITH DIFFERENTIAL/PLATELET
BASOS ABS: 0 10*3/uL (ref 0.0–0.1)
Basophils Relative: 0 %
Eosinophils Absolute: 0 10*3/uL (ref 0.0–0.7)
Eosinophils Relative: 0 %
HCT: 45.1 % (ref 39.0–52.0)
Hemoglobin: 15.9 g/dL (ref 13.0–17.0)
Lymphocytes Relative: 36 %
Lymphs Abs: 1.7 10*3/uL (ref 0.7–4.0)
MCH: 30.6 pg (ref 26.0–34.0)
MCHC: 35.3 g/dL (ref 30.0–36.0)
MCV: 86.7 fL (ref 78.0–100.0)
Monocytes Absolute: 0.6 10*3/uL (ref 0.1–1.0)
Monocytes Relative: 12 %
Neutro Abs: 2.5 10*3/uL (ref 1.7–7.7)
Neutrophils Relative %: 52 %
PLATELETS: 230 10*3/uL (ref 150–400)
RBC: 5.2 MIL/uL (ref 4.22–5.81)
RDW: 12.4 % (ref 11.5–15.5)
WBC: 4.7 10*3/uL (ref 4.0–10.5)

## 2017-03-31 LAB — ETHANOL: ALCOHOL ETHYL (B): 373 mg/dL — AB (ref <5)

## 2017-03-31 MED ORDER — TETANUS-DIPHTH-ACELL PERTUSSIS 5-2.5-18.5 LF-MCG/0.5 IM SUSP
0.5000 mL | Freq: Once | INTRAMUSCULAR | Status: AC
  Administered 2017-03-31: 0.5 mL via INTRAMUSCULAR
  Filled 2017-03-31: qty 0.5

## 2017-03-31 MED ORDER — LIDOCAINE HCL (PF) 1 % IJ SOLN
5.0000 mL | Freq: Once | INTRAMUSCULAR | Status: AC
  Administered 2017-03-31: 5 mL
  Filled 2017-03-31: qty 5

## 2017-03-31 NOTE — ED Provider Notes (Signed)
.   Please see previous physicians note regarding patient's presenting history and physical, initial ED course, and associated medical decision making. Presents with acute alcohol intoxication and fall. Previous CT imaging is unremarkable. Pending sober reevaluation at time of sign out.  Patient reevaluated at 11:47 AM. He is awake, alert, fully oriented, and answering questions appropriately. He has no complaints. Neuro in tact. No other injuries.  His vital signs are stable. He states that he is ready for discharge home. He is given by mouth challenge. Strict return and follow-up instructions reviewed. He expressed understanding of all discharge instructions and felt comfortable with the plan of care.    Forde Dandy, MD 03/31/17 [id number]
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
## 2017-03-31 NOTE — ED Provider Notes (Signed)
LACERATION REPAIR Performed by: Frederica Kuster Authorized by: Frederica Kuster Consent: Verbal consent obtained. Risks and benefits: risks, benefits and alternatives were discussed Consent given by: patient Patient identity confirmed: provided demographic data Prepped and Draped in normal sterile fashion Wound explored  Laceration Location: below R eye  Laceration Length: 1.5cm  No Foreign Bodies seen or palpated  Anesthesia: local infiltration  Local anesthetic: lidocaine 1% w/o epinephrine  Anesthetic total: 1 ml  Irrigation method: syringe saline Amount of cleaning: standard  Skin closure: 5-0 Fast absorbing gut  Number of sutures: 3  Technique: simple interrupted  Patient tolerance: Patient tolerated the procedure well with no immediate complications.    Frederica Kuster, PA-C 03/31/17 1610    Merryl Hacker, MD 04/04/17 2300

## 2017-03-31 NOTE — ED Notes (Signed)
Pt has dried blood on face from reported fall yesterday evening. Pt denies knowing where he was when he fell. This Probation officer gave pt warm wet rag to wash face with. Pt reports having 2 beers yesterday and denies being a daily drinker.

## 2017-11-19 ENCOUNTER — Encounter (HOSPITAL_COMMUNITY): Payer: Self-pay

## 2017-11-19 ENCOUNTER — Emergency Department (HOSPITAL_COMMUNITY)
Admission: EM | Admit: 2017-11-19 | Discharge: 2017-11-19 | Disposition: A | Discharge status: Home / Self Care | Payer: Self-pay | Attending: Emergency Medicine | Admitting: Emergency Medicine

## 2017-11-19 ENCOUNTER — Other Ambulatory Visit: Payer: Self-pay

## 2017-11-19 DIAGNOSIS — F17210 Nicotine dependence, cigarettes, uncomplicated: Secondary | ICD-10-CM | POA: Insufficient documentation

## 2017-11-19 DIAGNOSIS — I10 Essential (primary) hypertension: Secondary | ICD-10-CM | POA: Insufficient documentation

## 2017-11-19 DIAGNOSIS — F419 Anxiety disorder, unspecified: Secondary | ICD-10-CM | POA: Insufficient documentation

## 2017-11-19 DIAGNOSIS — Z859 Personal history of malignant neoplasm, unspecified: Secondary | ICD-10-CM | POA: Insufficient documentation

## 2017-11-19 DIAGNOSIS — G8929 Other chronic pain: Secondary | ICD-10-CM | POA: Insufficient documentation

## 2017-11-19 DIAGNOSIS — R51 Headache: Secondary | ICD-10-CM | POA: Insufficient documentation

## 2017-11-19 DIAGNOSIS — Z79899 Other long term (current) drug therapy: Secondary | ICD-10-CM | POA: Insufficient documentation

## 2017-11-19 DIAGNOSIS — F329 Major depressive disorder, single episode, unspecified: Secondary | ICD-10-CM | POA: Insufficient documentation

## 2017-11-19 LAB — CBC WITH DIFFERENTIAL/PLATELET
BASOS ABS: 0 10*3/uL (ref 0.0–0.1)
Basophils Relative: 0 %
Eosinophils Absolute: 0.1 10*3/uL (ref 0.0–0.7)
Eosinophils Relative: 2 %
HEMATOCRIT: 41.5 % (ref 39.0–52.0)
HEMOGLOBIN: 14.6 g/dL (ref 13.0–17.0)
LYMPHS PCT: 45 %
Lymphs Abs: 2.2 10*3/uL (ref 0.7–4.0)
MCH: 32.6 pg (ref 26.0–34.0)
MCHC: 35.2 g/dL (ref 30.0–36.0)
MCV: 92.6 fL (ref 78.0–100.0)
MONO ABS: 0.3 10*3/uL (ref 0.1–1.0)
Monocytes Relative: 7 %
NEUTROS PCT: 46 %
Neutro Abs: 2.3 10*3/uL (ref 1.7–7.7)
Platelets: 265 10*3/uL (ref 150–400)
RBC: 4.48 MIL/uL (ref 4.22–5.81)
RDW: 13.4 % (ref 11.5–15.5)
WBC: 4.9 10*3/uL (ref 4.0–10.5)

## 2017-11-19 LAB — COMPREHENSIVE METABOLIC PANEL
ALT: 19 U/L (ref 17–63)
AST: 40 U/L (ref 15–41)
Albumin: 4.5 g/dL (ref 3.5–5.0)
Alkaline Phosphatase: 69 U/L (ref 38–126)
Anion gap: 10 (ref 5–15)
BUN: 13 mg/dL (ref 6–20)
CO2: 26 mmol/L (ref 22–32)
Calcium: 9.5 mg/dL (ref 8.9–10.3)
Chloride: 102 mmol/L (ref 101–111)
Creatinine, Ser: 1.01 mg/dL (ref 0.61–1.24)
GFR calc non Af Amer: >60 mL/min (ref >60)
Glucose, Bld: 103 mg/dL — ABNORMAL HIGH (ref 65–99)
POTASSIUM: 4.8 mmol/L (ref 3.5–5.1)
SODIUM: 138 mmol/L (ref 135–145)
TOTAL PROTEIN: 7.8 g/dL (ref 6.5–8.1)
Total Bilirubin: 1.2 mg/dL (ref 0.3–1.2)

## 2017-11-19 MED ORDER — ACETAMINOPHEN 325 MG PO TABS
650.0000 mg | ORAL_TABLET | Freq: Once | ORAL | Status: AC
  Administered 2017-11-19: 650 mg via ORAL
  Filled 2017-11-19: qty 2

## 2017-11-19 NOTE — ED Notes (Signed)
Pt verbalized understanding discharge instructions and denies any further needs or questions at this time. VS stable, ambulatory and steady gait.   

## 2017-11-19 NOTE — Discharge Instructions (Addendum)
Take Tylenol 650 mg every 4 hours as needed for headache.  Call the number on these instructions on Monday 11/22/2017 to arrange to get a primary care physician.  Your primary care physician can help you with headaches and any other illnesses.  Your blood pressure should be rechecked within the next 3 weeks.  Today's was mildly elevated 132/83

## 2017-11-19 NOTE — ED Triage Notes (Signed)
Pt reports headache for several days. He states he had 2 episodes of emesis in the last 24 hours. Pt alert and oriented X4. No distress noted.
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
## 2019-04-09 ENCOUNTER — Encounter (HOSPITAL_COMMUNITY): Payer: Self-pay | Admitting: Emergency Medicine

## 2019-04-09 ENCOUNTER — Other Ambulatory Visit: Payer: Self-pay

## 2019-04-09 ENCOUNTER — Emergency Department (HOSPITAL_COMMUNITY): Payer: Self-pay

## 2019-04-09 ENCOUNTER — Emergency Department (HOSPITAL_COMMUNITY)
Admission: EM | Admit: 2019-04-09 | Discharge: 2019-04-09 | Disposition: A | Discharge status: Home / Self Care | Payer: Self-pay | Attending: Emergency Medicine | Admitting: Emergency Medicine

## 2019-04-09 DIAGNOSIS — Z20828 Contact with and (suspected) exposure to other viral communicable diseases: Secondary | ICD-10-CM | POA: Insufficient documentation

## 2019-04-09 DIAGNOSIS — Z20822 Contact with and (suspected) exposure to covid-19: Secondary | ICD-10-CM

## 2019-04-09 DIAGNOSIS — R05 Cough: Secondary | ICD-10-CM | POA: Insufficient documentation

## 2019-04-09 LAB — CBC WITH DIFFERENTIAL/PLATELET
Abs Immature Granulocytes: 0.01 10*3/uL (ref 0.00–0.07)
Basophils Absolute: 0 10*3/uL (ref 0.0–0.1)
Basophils Relative: 0 %
Eosinophils Absolute: 0.1 10*3/uL (ref 0.0–0.5)
Eosinophils Relative: 1 %
HCT: 42.9 % (ref 39.0–52.0)
Hemoglobin: 14.6 g/dL (ref 13.0–17.0)
Immature Granulocytes: 0 %
Lymphocytes Relative: 12 %
Lymphs Abs: 0.8 10*3/uL (ref 0.7–4.0)
MCH: 32.3 pg (ref 26.0–34.0)
MCHC: 34 g/dL (ref 30.0–36.0)
MCV: 94.9 fL (ref 80.0–100.0)
Monocytes Absolute: 0.5 10*3/uL (ref 0.1–1.0)
Monocytes Relative: 8 %
Neutro Abs: 5.2 10*3/uL (ref 1.7–7.7)
Neutrophils Relative %: 79 %
Platelets: 257 10*3/uL (ref 150–400)
RBC: 4.52 MIL/uL (ref 4.22–5.81)
RDW: 13.7 % (ref 11.5–15.5)
WBC: 6.7 10*3/uL (ref 4.0–10.5)
nRBC: 0 % (ref 0.0–0.2)

## 2019-04-09 LAB — SARS CORONAVIRUS 2 BY RT PCR (HOSPITAL ORDER, PERFORMED IN ~~LOC~~ HOSPITAL LAB): SARS Coronavirus 2: NEGATIVE

## 2019-04-09 MED ORDER — ACETAMINOPHEN 325 MG PO TABS
650.0000 mg | ORAL_TABLET | Freq: Once | ORAL | Status: AC
  Administered 2019-04-09: 18:00:00 650 mg via ORAL
  Filled 2019-04-09: qty 2

## 2019-04-09 NOTE — ED Triage Notes (Addendum)
Pt to ED with c/o productive cough, onset yesterday, also elevated temp since this am.  Pt c/o pain in chest when coughing  Pt also c/o no taste

## 2019-04-09 NOTE — Discharge Instructions (Addendum)
Today your coronavirus test was negative however, based on your symptoms and contact with someone who has similar symptoms I highly suspect that this is a false negative and you need to assume that you have COVID.  Please start by taking Tylenol, if needed you may take ibuprofen also.  If at any point your symptoms worsen or you have additional concerns please seek additional medical care and evaluation.  If you develop pain in one specific location please seek additional medical care.  Please take Ibuprofen (Advil, motrin) and Tylenol (acetaminophen) to relieve your pain.  You may take up to 600 MG (3 pills) of normal strength ibuprofen every 8 hours as needed.  In between doses of ibuprofen you make take tylenol, up to 1,000 mg (two extra strength pills).  Do not take more than 3,000 mg tylenol in a 24 hour period.  Please check all medication labels as many medications such as pain and cold medications may contain tylenol.  Do not drink alcohol while taking these medications.  Do not take other NSAID'S while taking ibuprofen (such as aleve or naproxen).  Please take ibuprofen with food to decrease stomach upset.

## 2019-04-09 NOTE — ED Provider Notes (Signed)
[facility] EMERGENCY DEPARTMENT Provider Note   CSN: [id number] Arrival date & time: 04/09/19  1655     History   Chief Complaint Chief Complaint  Patient presents with  . Cough  . Fever    HPI Colin Gamble is a 33 y.o. male with a past medical history of alcohol abuse, anxiety, depression, who presents today for evaluation of productive cough, chest pain when coughing, nausea, myalgias, arthralgias and loss of sense of taste and smell since yesterday.  He reports that he has a friend who has similar symptoms.  He reports that his chest only hurts when he coughs.  His cough is productive.  He reports that all of his symptoms started yesterday.  He denies any specific localized abdominal or chest pain.  He was given Tylenol on arrival which he reports significantly improved his body aches and generally feeling unwell.  He denies any personal history of DVT/PE.  No recent leg swelling.  No hemoptysis or hormone use.  He denies any recent tick bites.    HPI  Past Medical History:  Diagnosis Date  . Anxiety   . 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 Depression     Patient Active Problem List   Diagnosis Date Noted  . Alcohol abuse 08/20/2013  . Depressive disorder 08/20/2013  . Aggressive behavior 08/20/2013  . Substance induced mood disorder ([place]) 08/20/2013    Past Surgical History:  Procedure Laterality Date  . BRAIN SURGERY     as a baby        Home Medications    Prior to Admission medications   Medication Sig Start Date End Date Taking? Authorizing Provider  acetaminophen (TYLENOL) 325 MG tablet Take 650 mg by mouth every 6 (six) hours as needed for moderate pain.   Yes [provider]  acetaminophen (TYLENOL) 500 MG tablet Take 1,000 mg by mouth every 6 (six) hours as needed for moderate pain.   Yes [provider]  benztropine (COGENTIN) 0.5 MG tablet Take 1 tablet (0.5 mg total) by mouth daily as needed for tremors.  Patient not taking: Reported on 04/09/2019 08/23/13   Lindell Spar I, NP  carbamazepine (TEGRETOL) 200 MG tablet Take 1 tablet (200 mg total) by mouth daily at 8 pm. For mood stabilization Patient not taking: Reported on 04/09/2019 08/23/13   Lindell Spar I, NP  haloperidol (HALDOL) 2 MG tablet Take 1 tablet (2 mg total) by mouth daily at 8 pm. For mood control Patient not taking: Reported on 04/09/2019 08/23/13   Lindell Spar I, NP  permethrin (ELIMITE) 5 % cream Apply to affected area once. Leave 8-14 hours and wash off Patient not taking: Reported on 04/09/2019 07/01/16   Shary Decamp, PA-C  traZODone (DESYREL) 50 MG tablet Take 1 tablet (50 mg total) by mouth at bedtime and may repeat dose one time if needed. For sleep Patient not taking: Reported on 04/09/2019 08/23/13   Lindell Spar I, NP    Family History No family history on file.  Social History Social History   Tobacco Use  . Smoking status: Current Every Day Smoker    Packs/day: 0.50    Years: 14.00    Pack years: 7.00    Types: Cigarettes  . Smokeless tobacco: Never Used  Substance Use Topics  . Alcohol use: Yes    Alcohol/week: 6.0 standard drinks    Types: 6 Cans of beer per week    Comment: twice  weekly  . Drug use: No     Allergies   Patient has no known allergies.   Review of Systems Review of Systems  Constitutional: Positive for chills, fatigue and fever.  HENT: Positive for congestion and sore throat. Negative for ear pain, trouble swallowing and voice change.   Respiratory: Positive for cough and shortness of breath (With exertion).   Cardiovascular: Positive for chest pain (Only when coughing).  Gastrointestinal: Positive for nausea. Negative for abdominal pain, diarrhea and vomiting.  Genitourinary: Negative for dysuria and frequency.  Musculoskeletal: Positive for arthralgias and myalgias. Negative for back pain, gait problem and neck pain.  Skin: Negative for color change and rash.  Neurological: Negative for  dizziness, weakness, light-headedness and headaches.  Psychiatric/Behavioral: Negative for confusion.     Physical Exam Updated Vital Signs BP 118/64   Pulse 61   Temp 99.8 F (37.7 C) (Oral)   Resp 18   Ht 5\' 5"  (1.651 m)   Wt 54.4 kg   SpO2 97%   BMI 19.97 kg/m   Physical Exam Vitals signs and nursing note reviewed.  Constitutional:      General: He is not in acute distress.    Appearance: He is well-developed. He is not ill-appearing.  HENT:     Head: Normocephalic and atraumatic.     Mouth/Throat:     Mouth: Mucous membranes are moist.     Pharynx: Oropharynx is clear. No oropharyngeal exudate or posterior oropharyngeal erythema.  Eyes:     Conjunctiva/sclera: Conjunctivae normal.  Neck:     Musculoskeletal: Normal range of motion and neck supple. No neck rigidity.  Cardiovascular:     Rate and Rhythm: Normal rate and regular rhythm.     Pulses: Normal pulses.     Heart sounds: Normal heart sounds. No murmur.  Pulmonary:     Effort: Pulmonary effort is normal. No respiratory distress.     Breath sounds: Normal breath sounds. No stridor. No wheezing.  Abdominal:     General: There is no distension.     Palpations: Abdomen is soft.     Tenderness: There is no abdominal tenderness. There is no guarding.  Musculoskeletal:     Right lower leg: No edema.     Left lower leg: No edema.  Skin:    General: Skin is warm and dry.  Neurological:     Mental Status: He is alert.     Comments: Is able to provide coherent history without slurred speech.  Interacts appropriately.  Psychiatric:        Mood and Affect: Mood normal.        Behavior: Behavior normal.      ED Treatments / Results  Labs (all labs ordered are listed, but only abnormal results are displayed) Labs Reviewed  SARS CORONAVIRUS 2 (HOSPITAL ORDER, [facility])  CBC WITH DIFFERENTIAL/PLATELET    EKG EKG Interpretation  Date/Time:  Sunday April 09 2019 17:14:08 EDT  Ventricular Rate:  68 PR Interval:  126 QRS Duration: 68 QT Interval:  344 QTC Calculation: 365 R Axis:   130 Text Interpretation:  Normal sinus rhythm with sinus arrhythmia Possible Left atrial enlargement Right axis deviation no sig change from old Confirmed by Charlesetta Shanks [phone]) on 04/09/2019 8:55:06 PM   Radiology Dg Chest Port 1 View  Result Date: 04/09/2019 CLINICAL DATA:  Reason for exam: fever, cough Pt. c/o productive cough, onset yesterday, also elevated temp since this am. Pt c/o pain in chest  when coughing Pt also c/o no taste EXAM: PORTABLE CHEST 1 VIEW COMPARISON:  Chest radiograph 04/03/2013 FINDINGS: Stable cardiomediastinal contours are within normal limits. The lungs are clear. No pneumothorax or large pleural effusion. No acute osseous abnormality in the visualized bones. IMPRESSION: No evidence of active disease. Electronically Signed   By: Audie Pinto M.D.   On: 04/09/2019 18:24    Procedures Procedures (including critical care time)  Medications Ordered in ED Medications  acetaminophen (TYLENOL) tablet 650 mg (650 mg Oral Given 04/09/19 1733)     Initial Impression / Assessment and Plan / ED Course  I have reviewed the triage vital signs and the nursing notes.  Pertinent labs & imaging results that were available during my care of the patient were reviewed by me and considered in my medical decision making (see chart for details).       Colin Gamble was evaluated in Emergency Department on 04/09/2019 for the symptoms described in the history of present illness. He was evaluated in the context of the global COVID-19 pandemic, which necessitated consideration that the patient might be at risk for infection with the SARS-CoV-2 virus that causes COVID-19. Institutional protocols and algorithms that pertain to the evaluation of patients at risk for COVID-19 are in a state of rapid change based on information released by regulatory bodies including the CDC and  federal and state organizations. These policies and algorithms were followed during the patient's care in the ED.  Patient presents today for evaluation of 1 day of generally feeling unwell with nausea, myalgias/arthralgias, cough and slight shortness of breath with exertion.  He reports that he has a close friend who is having similar symptoms and denies any tick bites or exposures.  Chest x-ray does not show evidence of pneumonia or other acute abnormality.  Lungs are clear to auscultation bilaterally.  On arrival he was febrile, he was treated with Tylenol after which he reported feeling better.  He is PERC negative.  Do not suspect PE.  He reported that most of his symptoms improved with Tylenol which also reduced his fever.  His abdomen is soft nontender nondistended.  He denies any rashes or other skin abnormalities.  COVID test was obtained which was negative.  I highly suspect that this is a false negative in the setting of the global pandemic.  We discussed that there is no indication for further testing at this time as he does not have any specific localizing symptoms.  Final Clinical Impressions(s) / ED Diagnoses   Final diagnoses:  Suspected Covid-19 Virus Infection    ED Discharge Orders    None       Ollen Gross 04/09/19 2059    Charlesetta Shanks, MD 04/17/19 1441

## 2021-07-06 ENCOUNTER — Encounter (HOSPITAL_COMMUNITY): Payer: Self-pay | Admitting: Emergency Medicine

## 2021-07-06 ENCOUNTER — Emergency Department (HOSPITAL_COMMUNITY): Payer: Self-pay

## 2021-07-06 ENCOUNTER — Other Ambulatory Visit: Payer: Self-pay

## 2021-07-06 ENCOUNTER — Emergency Department (HOSPITAL_COMMUNITY)
Admission: EM | Admit: 2021-07-06 | Discharge: 2021-07-06 | Payer: Self-pay | Attending: Emergency Medicine | Admitting: Emergency Medicine

## 2021-07-06 DIAGNOSIS — M542 Cervicalgia: Secondary | ICD-10-CM | POA: Insufficient documentation

## 2021-07-06 DIAGNOSIS — F17210 Nicotine dependence, cigarettes, uncomplicated: Secondary | ICD-10-CM | POA: Insufficient documentation

## 2021-07-06 DIAGNOSIS — R55 Syncope and collapse: Secondary | ICD-10-CM | POA: Insufficient documentation

## 2021-07-06 DIAGNOSIS — Z859 Personal history of malignant neoplasm, unspecified: Secondary | ICD-10-CM | POA: Insufficient documentation

## 2021-07-06 NOTE — ED Notes (Signed)
Pt had another syncopal episode in lobby with [employer].  Placed in recliner.

## 2021-07-06 NOTE — ED Notes (Signed)
Labs refused

## 2021-07-06 NOTE — ED Notes (Signed)
Pt refused [facility]G.

## 2021-07-06 NOTE — Discharge Instructions (Addendum)
Colin Gamble has been evaluated in the [facility], ED.  He is medically cleared for jail.  He had a reassuring EKG, chest x-ray, head and neck imaging. No new medication changes. Tylenol and motrin as needed for neck pain.

## 2021-07-06 NOTE — ED Provider Notes (Signed)
[facility] EMERGENCY DEPARTMENT Provider Note   CSN: [id number] Arrival date & time: 07/06/21  1713     History Chief Complaint  Patient presents with   Syncopal Episode    Colin Gamble is a 35 y.o. male.  The history is provided by the patient, the police and medical records.  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  Social History   Tobacco Use   Smoking status: Every Day    Packs/day: 0.50    Years: 14.00    Pack years: 7.00    Types: Cigarettes   Smokeless tobacco: Never  Substance Use Topics   Alcohol use: Yes    Alcohol/week: 6.0 standard drinks    Types: 6 Cans of beer per week    Comment: twice weekly   Drug use: Yes    Types: Cocaine    Home Medications Prior to Admission medications   Medication Sig Start Date End Date Taking? Authorizing Provider  acetaminophen (TYLENOL) 325 MG tablet Take 650 mg by mouth every 6 (six) hours as needed for moderate pain.    [provider]  acetaminophen (TYLENOL) 500 MG tablet Take 1,000 mg by mouth every 6 (six) hours as needed for moderate pain.    [provider]  benztropine (COGENTIN) 0.5 MG tablet Take 1 tablet (0.5 mg total) by mouth daily as needed for tremors. Patient not taking: Reported on 04/09/2019 08/23/13   Lindell Spar I, NP  carbamazepine (TEGRETOL) 200 MG tablet Take 1 tablet (200 mg total) by mouth daily at 8 pm. For mood stabilization Patient not taking: Reported on 04/09/2019 08/23/13   Lindell Spar I, NP  haloperidol (HALDOL) 2 MG tablet Take 1 tablet (2 mg total) by mouth daily at 8 pm. For mood control Patient not taking: Reported on 04/09/2019 08/23/13   Lindell Spar I, NP  permethrin (ELIMITE) 5 % cream Apply to affected area once. Leave 8-14 hours and wash off Patient not taking: Reported on 04/09/2019 07/01/16   Shary Decamp, PA-C  traZODone (DESYREL) 50 MG tablet Take 1 tablet (50 mg total) by mouth at bedtime and may repeat dose one time if needed. For sleep Patient not taking: Reported on 04/09/2019 08/23/13   Lindell Spar I, NP    Allergies    Patient has no known allergies.  Review of Systems   Review of Systems  Constitutional:  Negative for chills and fever.  HENT:  Negative for ear pain and sore throat.   Eyes:  Negative for pain and visual disturbance.  Respiratory:  Negative for cough and shortness of breath.   Cardiovascular:  Negative for chest pain and palpitations.  Gastrointestinal:  Negative for abdominal pain and vomiting.  Genitourinary:  Negative for dysuria and hematuria.  Musculoskeletal:  Positive for neck pain. Negative for arthralgias and back pain.  Skin:  Negative for color change and rash.  Neurological:  Negative for seizures, syncope and headaches.  All other systems reviewed and are negative.  Physical Exam Updated Vital Signs BP 102/69   Pulse 89   Temp 98.5 F (36.9 C) (Oral)   Resp 20   SpO2 100%   Physical Exam Vitals and nursing note reviewed.  Constitutional:      General: He is not in acute distress.    Appearance: Normal appearance. He is well-developed.  HENT:      Head: Normocephalic and atraumatic.     Right Ear: External ear normal.     Left Ear: External ear normal.     Nose: Nose normal. No congestion.     Mouth/Throat:     Mouth: Mucous membranes are moist.     Pharynx: Oropharynx is clear. No posterior oropharyngeal erythema.  Eyes:     Extraocular Movements: Extraocular movements intact.     Conjunctiva/sclera: Conjunctivae normal.     Pupils: Pupils are equal, round, and reactive to light.  Cardiovascular:     Rate and Rhythm: Normal rate and regular rhythm.     Pulses: Normal pulses.     Heart sounds: No murmur heard. Pulmonary:     Effort: Pulmonary effort is normal. No respiratory distress.     Breath sounds: Normal breath sounds. No wheezing, rhonchi or rales.  Abdominal:     General: Abdomen is flat. Bowel sounds are normal.     Palpations: Abdomen is soft.     Tenderness: There is no abdominal tenderness. There is no guarding or rebound.  Musculoskeletal:        General: No swelling or deformity. Normal range of motion.     Cervical back: Normal range of motion and neck supple. Tenderness (Left paracervical) present. No rigidity.  Skin:    General: Skin is warm and dry.     Capillary Refill: Capillary refill takes less than 2 seconds.     Findings: No rash.  Neurological:     General: No focal deficit present.     Mental Status: He is alert and oriented to person, place, and time.  Psychiatric:        Mood and Affect: Mood normal.    ED Results / Procedures / Treatments   Labs (all labs ordered are listed, but only abnormal results are displayed) Labs Reviewed  CBG MONITORING, ED    EKG EKG Interpretation  Date/Time:  Sunday July 06 2021 18:03:30 EST Ventricular Rate:  64 PR Interval:  140 QRS Duration: 74 QT Interval:  374 QTC Calculation: 385 R Axis:   95 Text Interpretation: Normal sinus rhythm Rightward axis Borderline ECG No significant change since last tracing Confirmed by Wandra Arthurs [phone]) on  07/06/2021 6:44:21 PM  Radiology DG Chest 2 View  Result Date: 07/06/2021 CLINICAL DATA:  Syncope EXAM: CHEST - 2 VIEW COMPARISON:  April 09, 2019 FINDINGS: The heart size and mediastinal contours are within normal limits. Both lungs are clear. The visualized skeletal structures are unremarkable. IMPRESSION: No active cardiopulmonary disease. Electronically Signed   By: Dahlia Bailiff M.D.   On: 07/06/2021 20:10    Procedures Procedures   Medications Ordered  in ED Medications - No data to display  ED Course  I have reviewed the triage vital signs and the nursing notes.  Pertinent labs & imaging results that were available during my care of the patient were reviewed by me and considered in my medical decision making (see chart for details).    MDM Rules/Calculators/A&P                          35 year old male presenting with neck pain as above.  He does appear intoxicated, which makes his cervical exam unreliable.  Left paracervical tenderness noted on exam.  No step-offs or deformities.  He is moving all his extremities.  CT head and C-spine obtained.  No acute traumatic injuries noted.  Chest x-ray showed no acute cardiopulmonary process.  Patient did reportedly syncopized while being detained.  EKG showed no signs of arrhythmia.  Attempted to obtain CBG.  Patient refusing stating that needles are against his religion.  He remains awake and alert, vitally stable.  Low suspicion for hypoglycemia. He is medically cleared.  He is appropriate for discharge to jail.    Final Clinical Impression(s) / ED Diagnoses Final diagnoses:  Neck pain on left side  Syncope, unspecified syncope type    Rx / DC Orders ED Discharge Orders     None        Idamae Lusher, MD 07/06/21 2046    Drenda Freeze, MD 07/06/21 2246
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
## 2021-07-06 NOTE — ED Notes (Signed)
Patient verbalizes understanding of d/c instructions. Opportunities for questions and answers were provided. Pt d/c from ED into police custody.

## 2021-07-06 NOTE — ED Notes (Signed)
Refused labwork

## 2022-05-12 IMAGING — CT CT HEAD W/O CM
3 series · 15 of 47 positions shown, 18 images · non-contrast
Comparison: March 31, 2017.

CLINICAL DATA: Syncope

EXAM:
CT HEAD WITHOUT CONTRAST
CT MAXILLOFACIAL WITHOUT CONTRAST
CT CERVICAL SPINE WITHOUT CONTRAST
TECHNIQUE: Multidetector CT imaging of the head, cervical spine, and
maxillofacial structures were performed using the standard protocol
without intravenous contrast. Multiplanar CT image reconstructions
of the cervical spine and maxillofacial structures were also
generated.

[Series 2: head 5.0 h30s · axial · 0.46mm/px · z∈[-122,+28]mm · 9 of 36 slices shown, 12 images]
[im 3/36  brain]
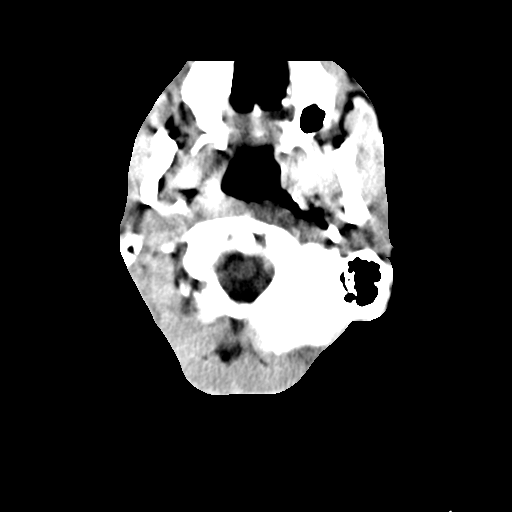
[im 3/36  bone]
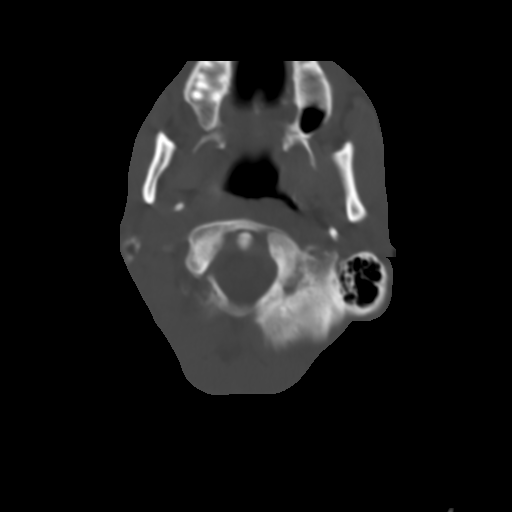
[im 7/36  brain]
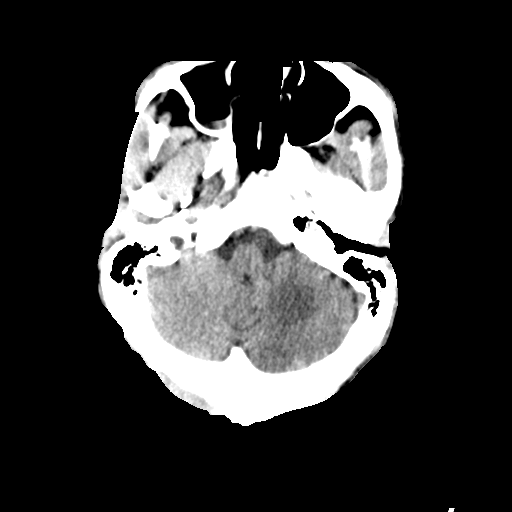
[im 10/36  brain]
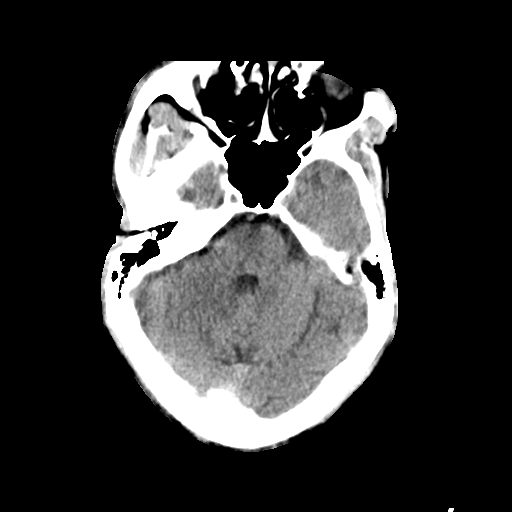
[im 14/36  brain]
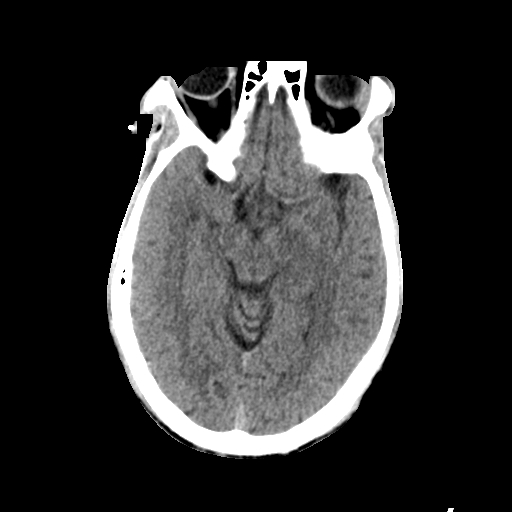
[im 19/36  brain]
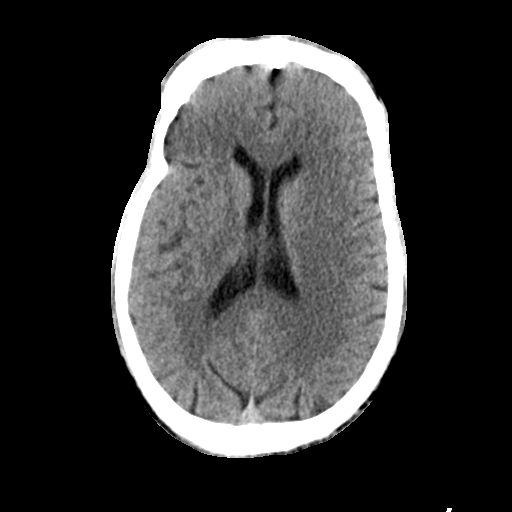
[im 19/36  bone]
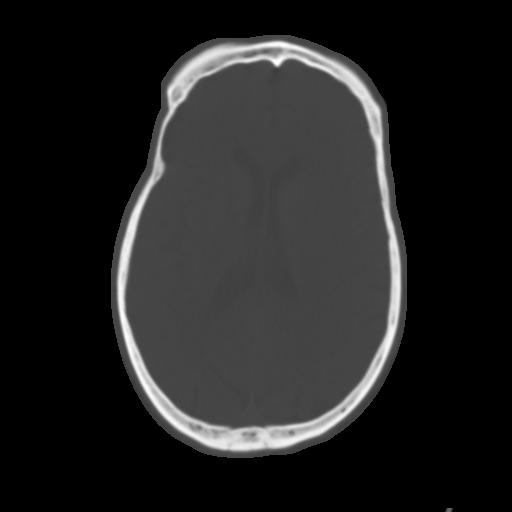
[im 22/36  brain]
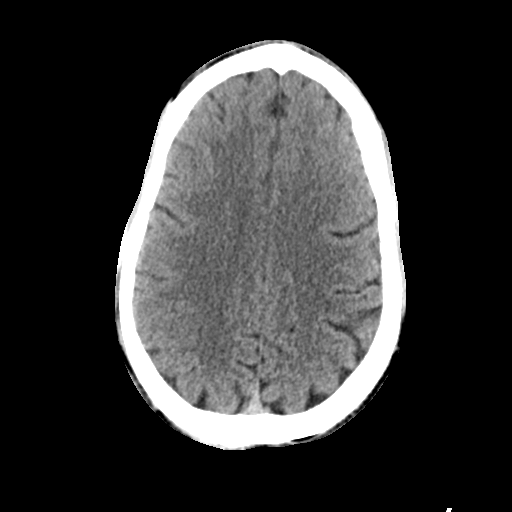
[im 26/36  brain]
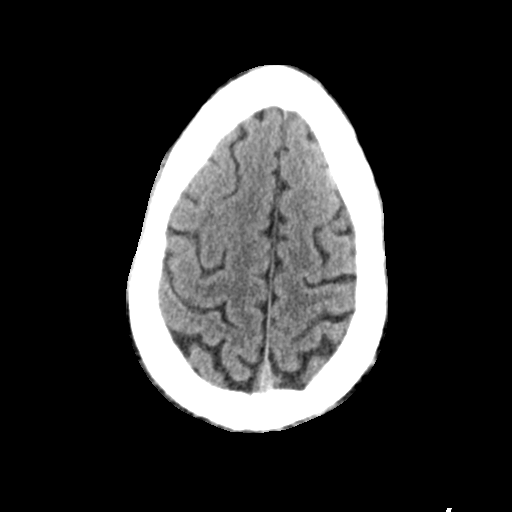
[im 29/36  brain]
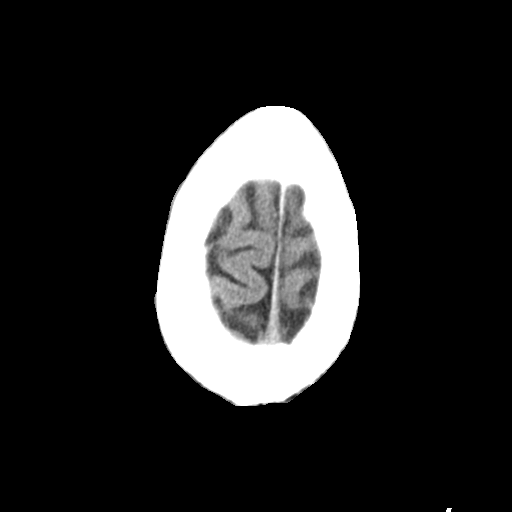
[im 33/36  brain]
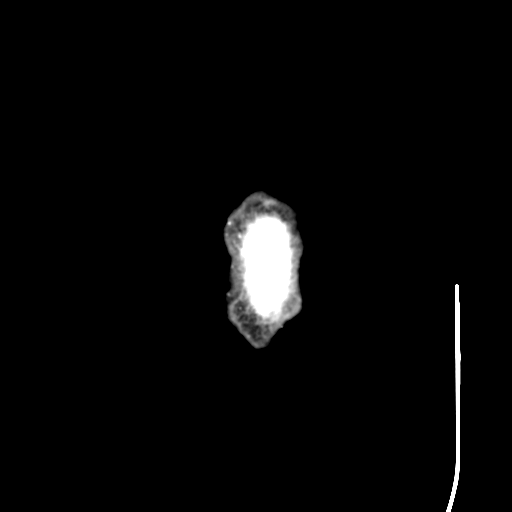
[im 33/36  bone]
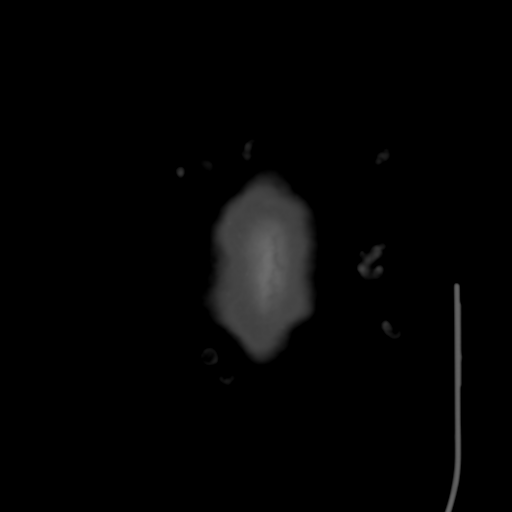

[Series 4: head 3.0 mpr cor · coronal · 0.36mm/px · 3 of 70 slices shown]
[im 24/70  brain]
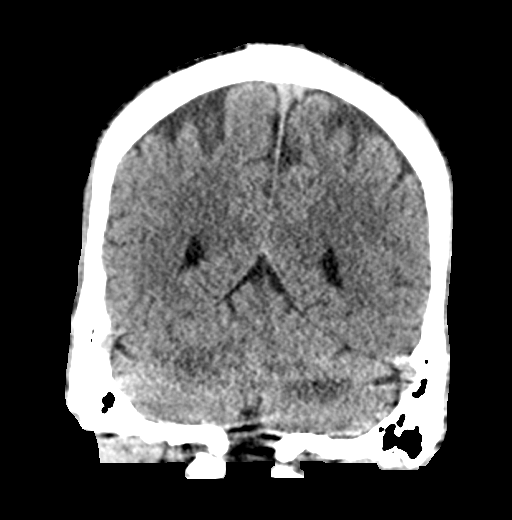
[im 31/70  brain]
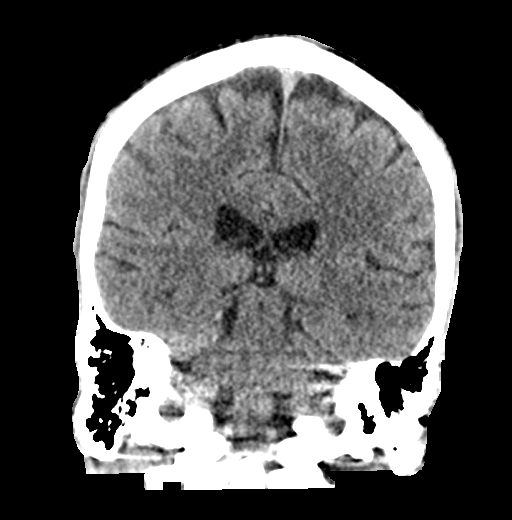
[im 39/70  brain]
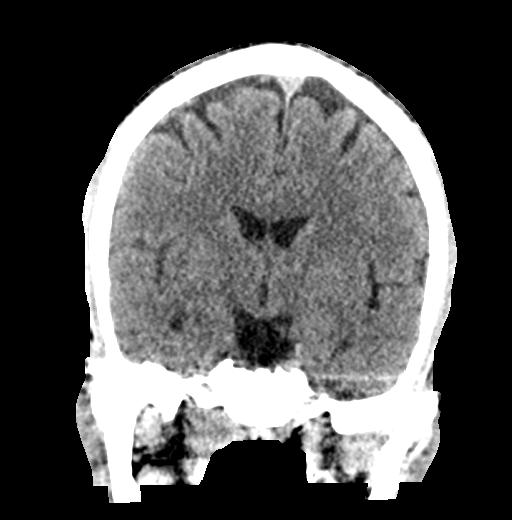

[Series 5: head 3.0 mpr sag · sagittal · 0.34mm/px · 3 of 55 slices shown]
[im 19/55  brain]
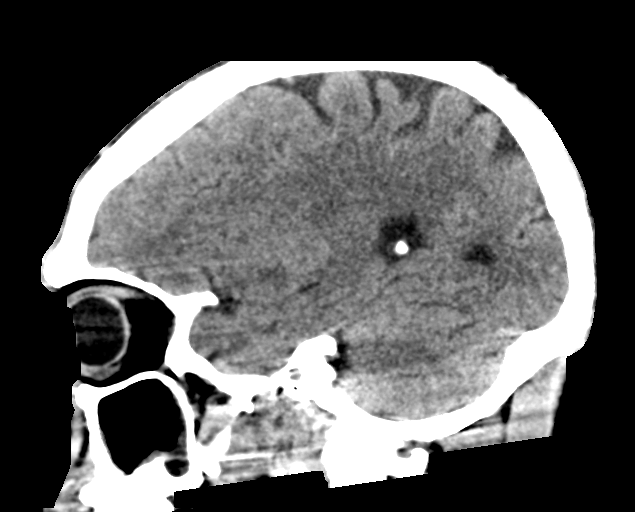
[im 28/55  brain]
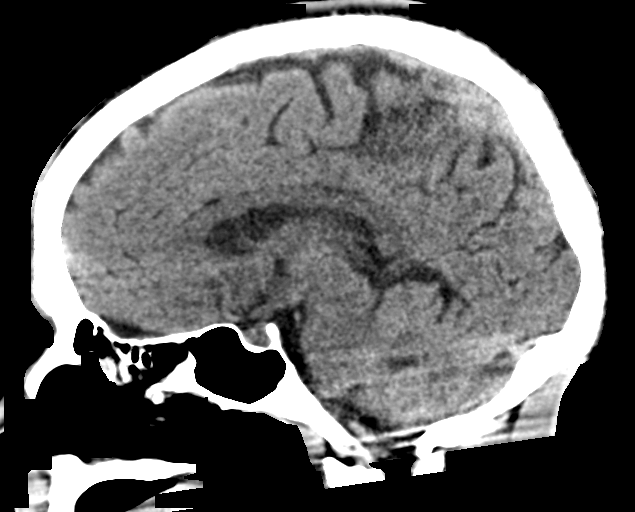
[im 37/55  brain]
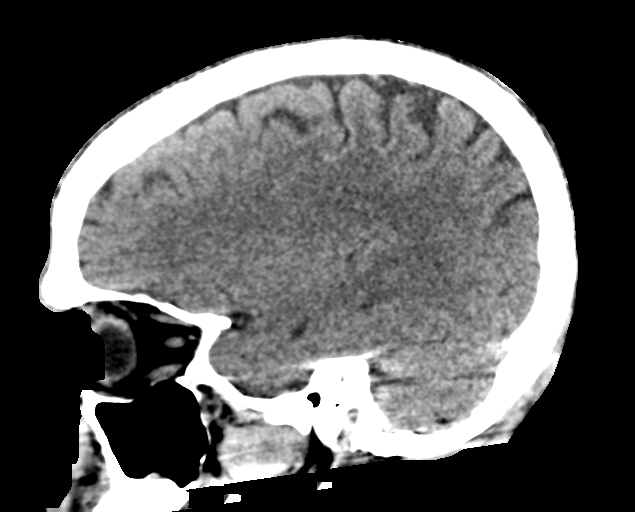

[15 of 47 positions shown; findings below may reference images not displayed]

FINDINGS: CT HEAD FINDINGS

Brain: No evidence of acute infarction, hemorrhage, hydrocephalus,
extra-axial collection or mass lesion/mass effect.

Vascular: No hyperdense vessel or unexpected calcification.

Skull: Normal. Negative for fracture or focal lesion.

Other: Mastoid air cells are predominantly clear.

CT MAXILLOFACIAL FINDINGS

Osseous: No fracture or mandibular dislocation. No destructive
process. Irregularity of the nasal bone appears similar dating back
to January 19, 2013.

Orbits: Negative. No traumatic or inflammatory finding.

Sinuses: Mucosal thickening of the bilateral maxillary sinuses.

Soft tissues: Negative.

CT CERVICAL SPINE FINDINGS

Alignment: Preservation of the normal cervical lordosis. No evidence
of traumatic listhesis.

Skull base and vertebrae: No acute fracture. No primary bone lesion
or focal pathologic process.

Soft tissues and spinal canal: No prevertebral fluid or swelling. No
visible canal hematoma.

Disc levels:  No level specific disease

Upper chest: Biapical emphysematous change.

Other: None
IMPRESSION: 1. No acute intracranial findings.
2. No evidence of acute facial bone fracture.
3. No evidence of acute fracture or subluxation of the cervical
spine.
4. Emphysema ([id number].X).

## 2022-05-12 IMAGING — CT CT MAXILLOFACIAL W/O CM
4 series · 16 of 47 positions shown, 18 images · non-contrast
Comparison: March 31, 2017.

CLINICAL DATA: Syncope

EXAM:
CT HEAD WITHOUT CONTRAST
CT MAXILLOFACIAL WITHOUT CONTRAST
CT CERVICAL SPINE WITHOUT CONTRAST
TECHNIQUE: Multidetector CT imaging of the head, cervical spine, and
maxillofacial structures were performed using the standard protocol
without intravenous contrast. Multiplanar CT image reconstructions
of the cervical spine and maxillofacial structures were also
generated.

[Series 2: facial/ orbits 2.0 h30s · axial · 0.33mm/px · z∈[-185,-55]mm · 8 of 85 slices shown, 10 images]
[im 10/85  brain]
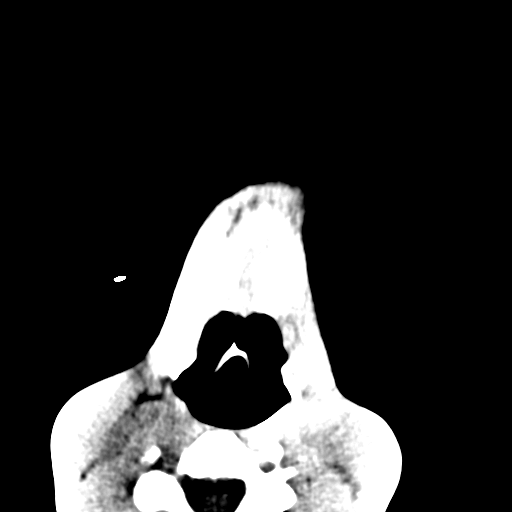
[im 10/85  bone]
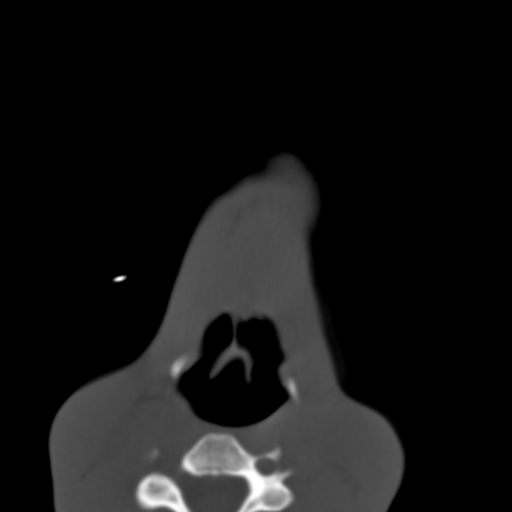
[im 19/85  bone]
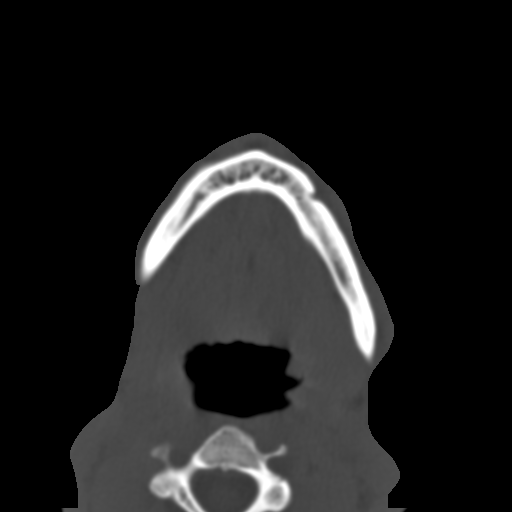
[im 29/85  bone]
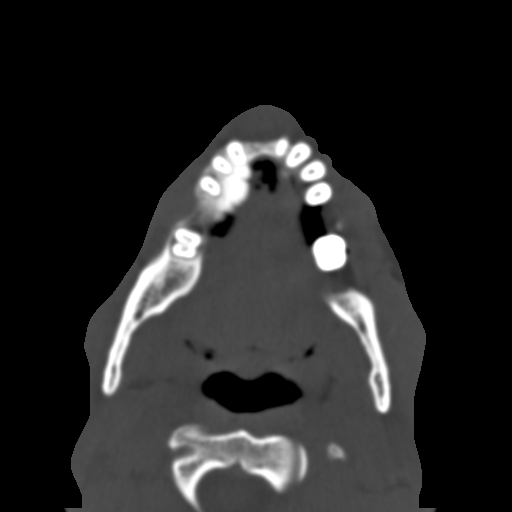
[im 38/85  bone]
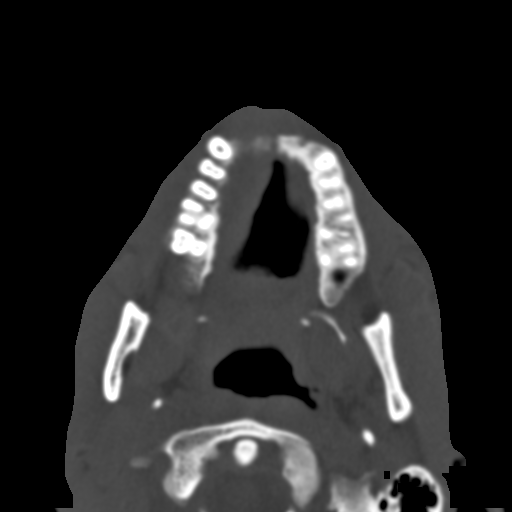
[im 47/85  brain]
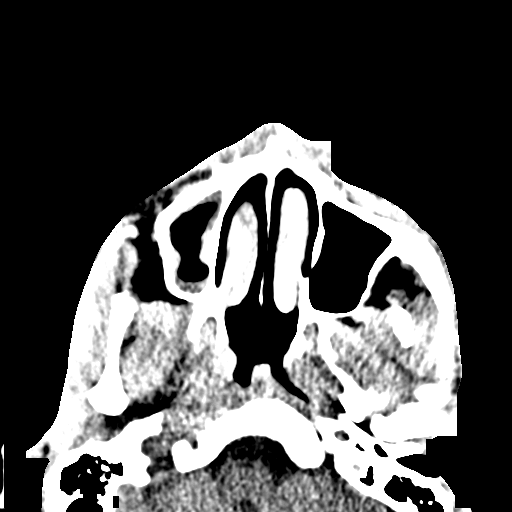
[im 47/85  bone]
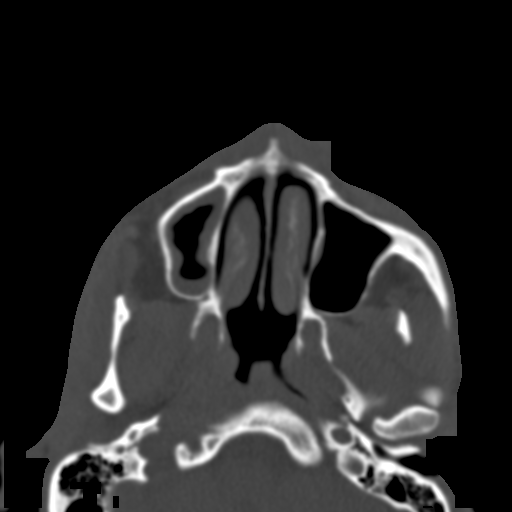
[im 57/85  bone]
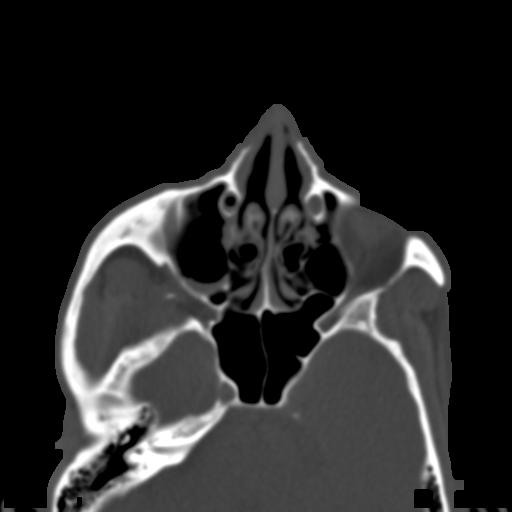
[im 66/85  bone]
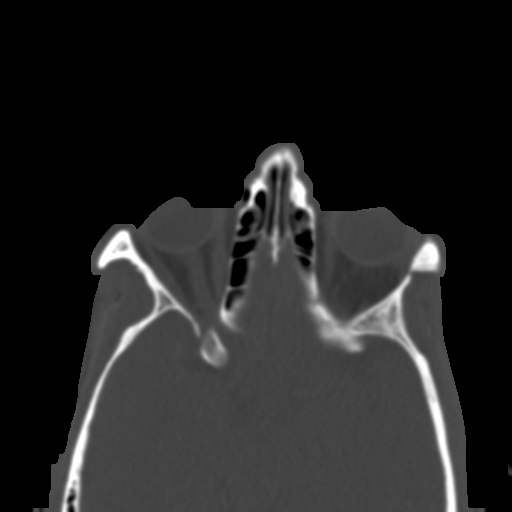
[im 75/85  bone]
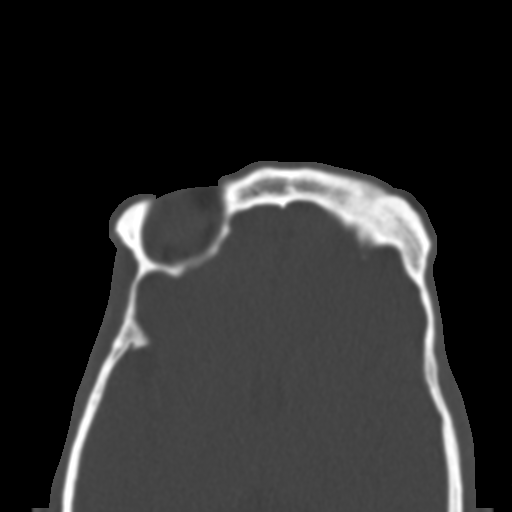

[Series 4: 1.0 thin soft tissue · axial · 0.33mm/px · z∈[-187,-169]mm · 2 of 170 slices shown]
[im 18/170  brain]
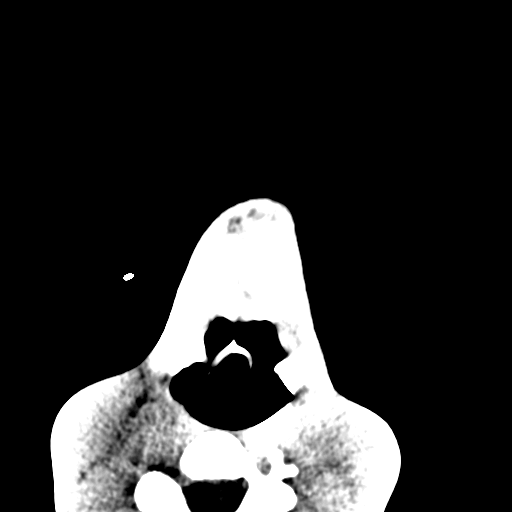
[im 36/170  brain]
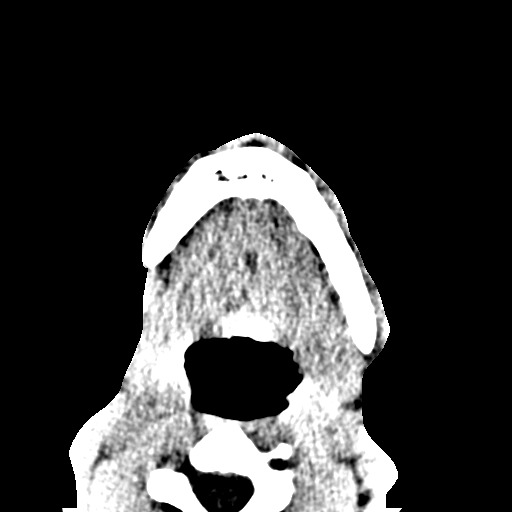

[Series 6: coronal soft tissue · coronal · 0.35mm/px · 3 of 77 slices shown]
[im 26/77  bone]
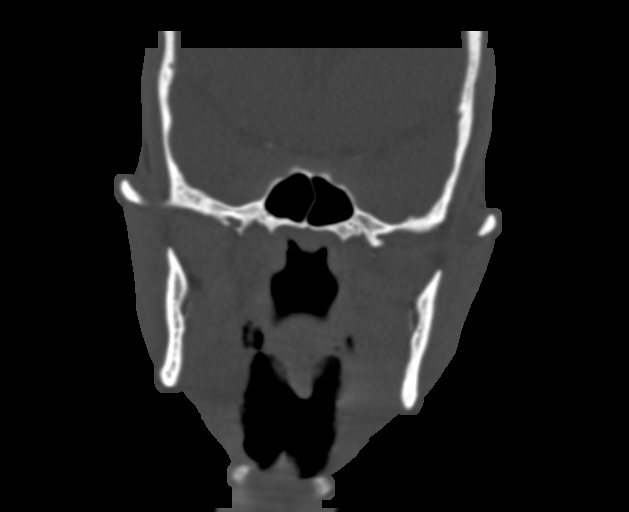
[im 34/77  bone]
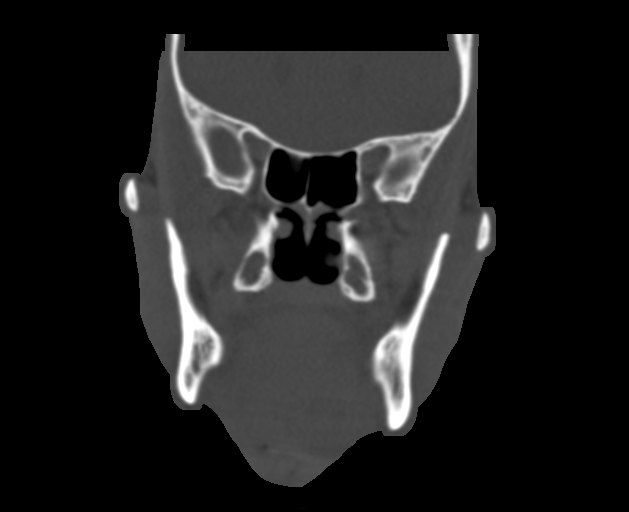
[im 43/77  bone]
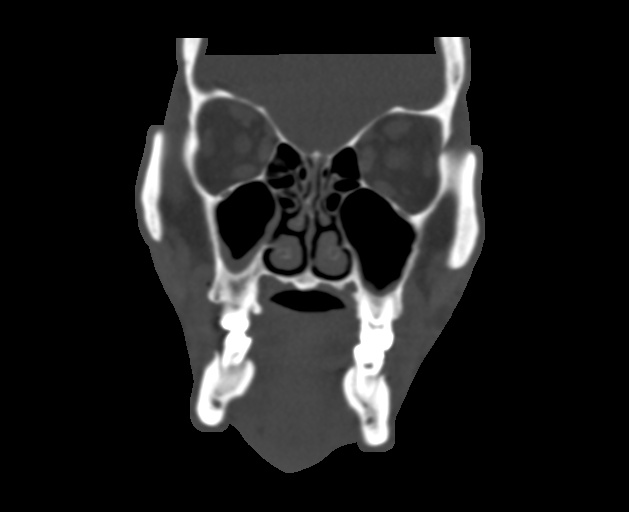

[Series 7: sagittal soft tissue · sagittal · 0.33mm/px · 3 of 85 slices shown]
[im 29/85  bone]
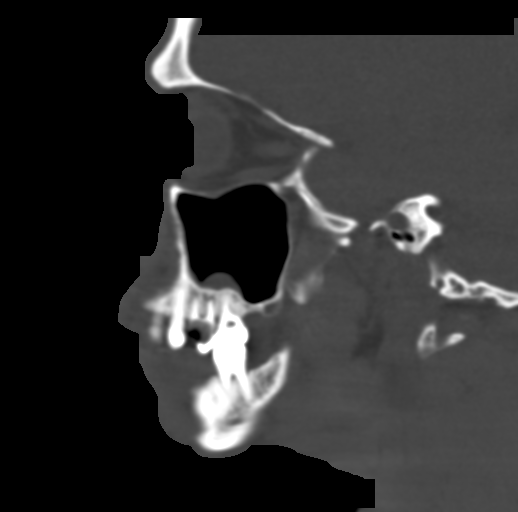
[im 43/85  bone]
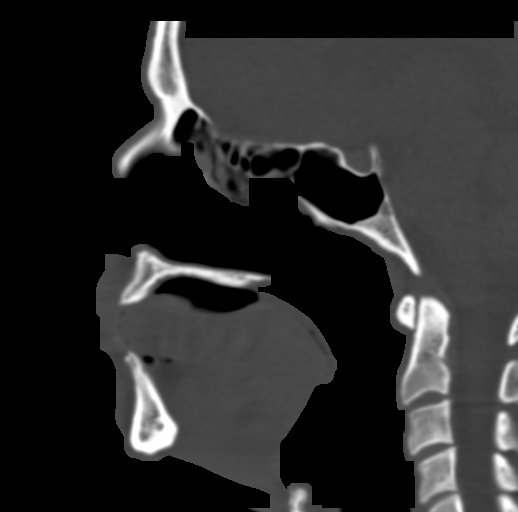
[im 57/85  bone]
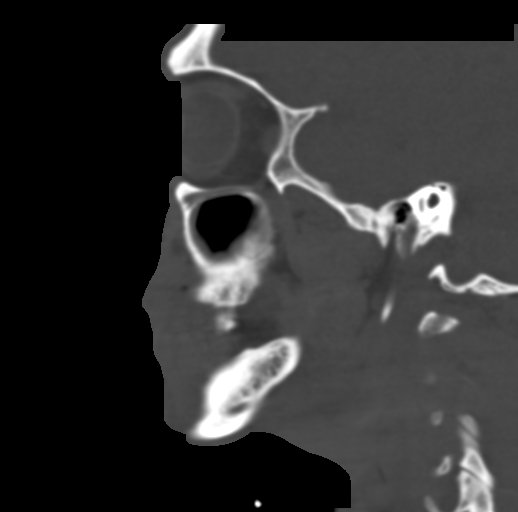

[16 of 47 positions shown; findings below may reference images not displayed]

FINDINGS: CT HEAD FINDINGS

Brain: No evidence of acute infarction, hemorrhage, hydrocephalus,
extra-axial collection or mass lesion/mass effect.

Vascular: No hyperdense vessel or unexpected calcification.

Skull: Normal. Negative for fracture or focal lesion.

Other: Mastoid air cells are predominantly clear.

CT MAXILLOFACIAL FINDINGS

Osseous: No fracture or mandibular dislocation. No destructive
process. Irregularity of the nasal bone appears similar dating back
to January 19, 2013.

Orbits: Negative. No traumatic or inflammatory finding.

Sinuses: Mucosal thickening of the bilateral maxillary sinuses.

Soft tissues: Negative.

CT CERVICAL SPINE FINDINGS

Alignment: Preservation of the normal cervical lordosis. No evidence
of traumatic listhesis.

Skull base and vertebrae: No acute fracture. No primary bone lesion
or focal pathologic process.

Soft tissues and spinal canal: No prevertebral fluid or swelling. No
visible canal hematoma.

Disc levels:  No level specific disease

Upper chest: Biapical emphysematous change.

Other: None
IMPRESSION: 1. No acute intracranial findings.
2. No evidence of acute facial bone fracture.
3. No evidence of acute fracture or subluxation of the cervical
spine.
4. Emphysema ([id number].X).

## 2022-10-14 LAB — GLUCOSE, POCT (MANUAL RESULT ENTRY): Glucose Fasting, POC: 139 mg/dL — AB (ref 70–99)

## 2022-10-14 LAB — HEMOGLOBIN A1C: Hemoglobin A1C: 5.4

## 2022-10-14 NOTE — Progress Notes (Signed)
Pt is fasting. Pt needs PCP and given list of clinics

## 2022-11-17 ENCOUNTER — Emergency Department (HOSPITAL_COMMUNITY): Payer: Self-pay

## 2022-11-17 ENCOUNTER — Emergency Department (HOSPITAL_COMMUNITY)
Admission: EM | Admit: 2022-11-17 | Discharge: 2022-11-17 | Disposition: A | Discharge status: Home / Self Care | Payer: Self-pay | Attending: Emergency Medicine | Admitting: Emergency Medicine

## 2022-11-17 DIAGNOSIS — F10120 Alcohol abuse with intoxication, uncomplicated: Secondary | ICD-10-CM | POA: Insufficient documentation

## 2022-11-17 DIAGNOSIS — Y908 Blood alcohol level of 240 mg/100 ml or more: Secondary | ICD-10-CM | POA: Insufficient documentation

## 2022-11-17 DIAGNOSIS — F10920 Alcohol use, unspecified with intoxication, uncomplicated: Secondary | ICD-10-CM

## 2022-11-17 LAB — CBC WITH DIFFERENTIAL/PLATELET
Abs Immature Granulocytes: 0 10*3/uL (ref 0.00–0.07)
Basophils Absolute: 0 10*3/uL (ref 0.0–0.1)
Basophils Relative: 1 %
Eosinophils Absolute: 0.1 10*3/uL (ref 0.0–0.5)
Eosinophils Relative: 3 %
HCT: 41.1 % (ref 39.0–52.0)
Hemoglobin: 14 g/dL (ref 13.0–17.0)
Immature Granulocytes: 0 %
Lymphocytes Relative: 52 %
Lymphs Abs: 2.2 10*3/uL (ref 0.7–4.0)
MCH: 31 pg (ref 26.0–34.0)
MCHC: 34.1 g/dL (ref 30.0–36.0)
MCV: 91.1 fL (ref 80.0–100.0)
Monocytes Absolute: 0.6 10*3/uL (ref 0.1–1.0)
Monocytes Relative: 14 %
Neutro Abs: 1.2 10*3/uL — ABNORMAL LOW (ref 1.7–7.7)
Neutrophils Relative %: 30 %
Platelets: 285 10*3/uL (ref 150–400)
RBC: 4.51 MIL/uL (ref 4.22–5.81)
RDW: 12.8 % (ref 11.5–15.5)
WBC: 4.1 10*3/uL (ref 4.0–10.5)
nRBC: 0 % (ref 0.0–0.2)

## 2022-11-17 LAB — RAPID URINE DRUG SCREEN, HOSP PERFORMED
Amphetamines: POSITIVE — AB
Barbiturates: NOT DETECTED
Benzodiazepines: NOT DETECTED
Cocaine: POSITIVE — AB
Opiates: NOT DETECTED
Tetrahydrocannabinol: NOT DETECTED

## 2022-11-17 LAB — I-STAT CHEM 8, ED
BUN: 11 mg/dL (ref 6–20)
Calcium, Ion: 1.1 mmol/L — ABNORMAL LOW (ref 1.15–1.40)
Chloride: 102 mmol/L (ref 98–111)
Creatinine, Ser: 1.1 mg/dL (ref 0.61–1.24)
Glucose, Bld: 100 mg/dL — ABNORMAL HIGH (ref 70–99)
HCT: 43 % (ref 39.0–52.0)
Hemoglobin: 14.6 g/dL (ref 13.0–17.0)
Potassium: 3.2 mmol/L — ABNORMAL LOW (ref 3.5–5.1)
Sodium: 139 mmol/L (ref 135–145)
TCO2: 27 mmol/L (ref 22–32)

## 2022-11-17 LAB — CBG MONITORING, ED: Glucose-Capillary: 94 mg/dL (ref 70–99)

## 2022-11-17 LAB — BASIC METABOLIC PANEL
Anion gap: 11 (ref 5–15)
BUN: 9 mg/dL (ref 6–20)
CO2: 24 mmol/L (ref 22–32)
Calcium: 8.6 mg/dL — ABNORMAL LOW (ref 8.9–10.3)
Chloride: 102 mmol/L (ref 98–111)
Creatinine, Ser: 0.82 mg/dL (ref 0.61–1.24)
GFR, Estimated: >60 mL/min (ref >60)
Glucose, Bld: 102 mg/dL — ABNORMAL HIGH (ref 70–99)
Potassium: 3.1 mmol/L — ABNORMAL LOW (ref 3.5–5.1)
Sodium: 137 mmol/L (ref 135–145)

## 2022-11-17 LAB — ETHANOL: Alcohol, Ethyl (B): 236 mg/dL — ABNORMAL HIGH (ref <10)

## 2022-11-17 MED ORDER — SODIUM CHLORIDE 0.9 % IV BOLUS
1000.0000 mL | Freq: Once | INTRAVENOUS | Status: AC
  Administered 2022-11-17: 1000 mL via INTRAVENOUS

## 2022-11-17 NOTE — ED Notes (Signed)
CBG 94 
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
## 2022-11-17 NOTE — ED Notes (Signed)
Patient transported to CT 
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
## 2022-11-17 NOTE — Discharge Instructions (Signed)
Return for any problem.  Drink alcohol in moderation. 
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
## 2022-11-17 NOTE — ED Notes (Signed)
Condom Cath applied to pt.

## 2022-12-19 ENCOUNTER — Encounter (HOSPITAL_COMMUNITY): Payer: Self-pay

## 2022-12-19 ENCOUNTER — Emergency Department (HOSPITAL_COMMUNITY)
Admission: EM | Admit: 2022-12-19 | Discharge: 2022-12-19 | Discharge status: Against Medical Advice | Payer: Self-pay | Attending: Emergency Medicine | Admitting: Emergency Medicine

## 2022-12-19 ENCOUNTER — Other Ambulatory Visit: Payer: Self-pay

## 2022-12-19 DIAGNOSIS — I959 Hypotension, unspecified: Secondary | ICD-10-CM | POA: Insufficient documentation

## 2022-12-19 DIAGNOSIS — Z5321 Procedure and treatment not carried out due to patient leaving prior to being seen by health care provider: Secondary | ICD-10-CM | POA: Insufficient documentation

## 2022-12-19 LAB — CBC WITH DIFFERENTIAL/PLATELET
Abs Immature Granulocytes: 0.02 10*3/uL (ref 0.00–0.07)
Basophils Absolute: 0 10*3/uL (ref 0.0–0.1)
Basophils Relative: 1 %
Eosinophils Absolute: 0.1 10*3/uL (ref 0.0–0.5)
Eosinophils Relative: 2 %
HCT: 49.3 % (ref 39.0–52.0)
Hemoglobin: 16.5 g/dL (ref 13.0–17.0)
Immature Granulocytes: 0 %
Lymphocytes Relative: 50 %
Lymphs Abs: 2.5 10*3/uL (ref 0.7–4.0)
MCH: 31 pg (ref 26.0–34.0)
MCHC: 33.5 g/dL (ref 30.0–36.0)
MCV: 92.5 fL (ref 80.0–100.0)
Monocytes Absolute: 0.5 10*3/uL (ref 0.1–1.0)
Monocytes Relative: 11 %
Neutro Abs: 1.7 10*3/uL (ref 1.7–7.7)
Neutrophils Relative %: 36 %
Platelets: 249 10*3/uL (ref 150–400)
RBC: 5.33 MIL/uL (ref 4.22–5.81)
RDW: 13.7 % (ref 11.5–15.5)
WBC: 4.9 10*3/uL (ref 4.0–10.5)
nRBC: 0 % (ref 0.0–0.2)

## 2022-12-19 LAB — BASIC METABOLIC PANEL
Anion gap: 9 (ref 5–15)
BUN: 6 mg/dL (ref 6–20)
CO2: 26 mmol/L (ref 22–32)
Calcium: 8.6 mg/dL — ABNORMAL LOW (ref 8.9–10.3)
Chloride: 103 mmol/L (ref 98–111)
Creatinine, Ser: 1.25 mg/dL — ABNORMAL HIGH (ref 0.61–1.24)
GFR, Estimated: >60 mL/min (ref >60)
Glucose, Bld: 124 mg/dL — ABNORMAL HIGH (ref 70–99)
Potassium: 4 mmol/L (ref 3.5–5.1)
Sodium: 138 mmol/L (ref 135–145)

## 2022-12-19 MED ORDER — SODIUM CHLORIDE 0.9 % IV BOLUS: 1000.0000 mL | Freq: Once | INTRAVENOUS | Status: DC

## 2022-12-19 NOTE — ED Notes (Signed)
Notified Dr Rodena Medin of pt's low bp. Dr Rodena Medin at bedside seeing pt

## 2022-12-19 NOTE — ED Notes (Signed)
I tried to place an IV in pt three times and was unsuccessful
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
